# Patient Record
Sex: Female | Born: 1955
Health system: Southern US, Community
[De-identification: ages and names within clinical notes are randomized; demographics above are authoritative.]

## PROBLEM LIST (undated history)

## (undated) DIAGNOSIS — C801 Malignant (primary) neoplasm, unspecified: Secondary | ICD-10-CM

## (undated) DIAGNOSIS — K5900 Constipation, unspecified: Secondary | ICD-10-CM

## (undated) DIAGNOSIS — J3089 Other allergic rhinitis: Secondary | ICD-10-CM

## (undated) DIAGNOSIS — M1990 Unspecified osteoarthritis, unspecified site: Secondary | ICD-10-CM

## (undated) DIAGNOSIS — J45909 Unspecified asthma, uncomplicated: Secondary | ICD-10-CM

## (undated) DIAGNOSIS — I878 Other specified disorders of veins: Principal | ICD-10-CM

## (undated) DIAGNOSIS — J189 Pneumonia, unspecified organism: Secondary | ICD-10-CM

## (undated) HISTORY — DX: Unspecified asthma, uncomplicated: J45.909

## (undated) HISTORY — DX: Other specified disorders of veins: I87.8

## (undated) HISTORY — DX: Other allergic rhinitis: J30.89

## (undated) HISTORY — PX: COLONOSCOPY: SHX174

---

## 2001-02-11 ENCOUNTER — Ambulatory Visit (HOSPITAL_COMMUNITY): Admission: RE | Admit: 2001-02-11 | Discharge: 2001-02-11 | Payer: Self-pay | Admitting: Specialist

## 2001-02-11 ENCOUNTER — Encounter: Payer: Self-pay | Admitting: Specialist

## 2001-03-28 ENCOUNTER — Other Ambulatory Visit: Admission: RE | Admit: 2001-03-28 | Discharge: 2001-03-28 | Payer: Self-pay | Admitting: Obstetrics and Gynecology

## 2004-06-09 ENCOUNTER — Other Ambulatory Visit: Admission: RE | Admit: 2004-06-09 | Discharge: 2004-06-09 | Payer: Self-pay | Admitting: Dermatology

## 2004-09-27 ENCOUNTER — Ambulatory Visit (HOSPITAL_COMMUNITY): Admission: RE | Admit: 2004-09-27 | Discharge: 2004-09-27 | Payer: Self-pay | Admitting: Specialist

## 2005-01-07 ENCOUNTER — Emergency Department (HOSPITAL_COMMUNITY): Admission: EM | Admit: 2005-01-07 | Discharge: 2005-01-07 | Payer: Self-pay | Admitting: Emergency Medicine

## 2005-11-23 ENCOUNTER — Ambulatory Visit (HOSPITAL_COMMUNITY): Admission: RE | Admit: 2005-11-23 | Discharge: 2005-11-23 | Payer: Self-pay | Admitting: Family Medicine

## 2005-12-05 ENCOUNTER — Ambulatory Visit (HOSPITAL_COMMUNITY): Admission: RE | Admit: 2005-12-05 | Discharge: 2005-12-05 | Payer: Self-pay | Admitting: Family Medicine

## 2007-02-10 ENCOUNTER — Ambulatory Visit (HOSPITAL_COMMUNITY): Admission: RE | Admit: 2007-02-10 | Discharge: 2007-02-10 | Payer: Self-pay | Admitting: Specialist

## 2007-02-26 ENCOUNTER — Ambulatory Visit (HOSPITAL_COMMUNITY): Admission: RE | Admit: 2007-02-26 | Discharge: 2007-02-26 | Payer: Self-pay | Admitting: Specialist

## 2008-02-20 ENCOUNTER — Ambulatory Visit: Payer: Self-pay | Admitting: Gastroenterology

## 2008-02-20 DIAGNOSIS — M129 Arthropathy, unspecified: Secondary | ICD-10-CM | POA: Insufficient documentation

## 2008-02-20 DIAGNOSIS — K602 Anal fissure, unspecified: Secondary | ICD-10-CM | POA: Insufficient documentation

## 2008-02-20 DIAGNOSIS — R109 Unspecified abdominal pain: Secondary | ICD-10-CM | POA: Insufficient documentation

## 2008-02-20 DIAGNOSIS — I10 Essential (primary) hypertension: Secondary | ICD-10-CM | POA: Insufficient documentation

## 2008-02-20 LAB — CONVERTED CEMR LAB
ALT: 15 units/L (ref 0–35)
AST: 18 units/L (ref 0–37)
Albumin: 3.3 g/dL — ABNORMAL LOW (ref 3.5–5.2)
Alkaline Phosphatase: 49 units/L (ref 39–117)
BUN: 15 mg/dL (ref 6–23)
Basophils Absolute: 0 10*3/uL (ref 0.0–0.1)
Basophils Relative: 0 % (ref 0.0–3.0)
Bilirubin, Direct: 0.1 mg/dL (ref 0.0–0.3)
CO2: 28 meq/L (ref 19–32)
Calcium: 8.8 mg/dL (ref 8.4–10.5)
Chloride: 108 meq/L (ref 96–112)
Creatinine, Ser: 0.7 mg/dL (ref 0.4–1.2)
Eosinophils Absolute: 0.2 10*3/uL (ref 0.0–0.7)
Eosinophils Relative: 3.2 % (ref 0.0–5.0)
Ferritin: 68.2 ng/mL (ref 10.0–291.0)
Folate: 11.1 ng/mL
GFR calc Af Amer: 113 mL/min
GFR calc non Af Amer: 93 mL/min
Glucose, Bld: 89 mg/dL (ref 70–99)
HCT: 36.5 % (ref 36.0–46.0)
Hemoglobin: 12.8 g/dL (ref 12.0–15.0)
Iron: 75 ug/dL (ref 42–145)
Lymphocytes Relative: 27.2 % (ref 12.0–46.0)
MCHC: 35.1 g/dL (ref 30.0–36.0)
MCV: 96.7 fL (ref 78.0–100.0)
Monocytes Absolute: 0.7 10*3/uL (ref 0.1–1.0)
Monocytes Relative: 8.8 % (ref 3.0–12.0)
Neutro Abs: 4.7 10*3/uL (ref 1.4–7.7)
Neutrophils Relative %: 60.8 % (ref 43.0–77.0)
Platelets: 239 10*3/uL (ref 150–400)
Potassium: 4.1 meq/L (ref 3.5–5.1)
RBC: 3.78 M/uL — ABNORMAL LOW (ref 3.87–5.11)
RDW: 12.1 % (ref 11.5–14.6)
Saturation Ratios: 22.1 % (ref 20.0–50.0)
Sodium: 143 meq/L (ref 135–145)
TSH: 1.93 microintl units/mL (ref 0.35–5.50)
Total Bilirubin: 0.5 mg/dL (ref 0.3–1.2)
Total Protein: 6.3 g/dL (ref 6.0–8.3)
Transferrin: 242.9 mg/dL (ref >=212.0)
Vitamin B-12: 252 pg/mL (ref 211–911)
WBC: 7.7 10*3/uL (ref 4.5–10.5)

## 2008-03-22 ENCOUNTER — Ambulatory Visit: Payer: Self-pay | Admitting: Gastroenterology

## 2008-03-25 ENCOUNTER — Telehealth: Payer: Self-pay | Admitting: Gastroenterology

## 2009-10-11 ENCOUNTER — Ambulatory Visit (HOSPITAL_COMMUNITY): Admission: RE | Admit: 2009-10-11 | Discharge: 2009-10-11 | Payer: Self-pay | Admitting: Obstetrics and Gynecology

## 2009-10-19 ENCOUNTER — Ambulatory Visit (HOSPITAL_COMMUNITY)
Admission: RE | Admit: 2009-10-19 | Discharge: 2009-10-19 | Payer: Self-pay | Source: Home / Self Care | Admitting: Obstetrics and Gynecology

## 2010-02-09 ENCOUNTER — Emergency Department (HOSPITAL_COMMUNITY)
Admission: EM | Admit: 2010-02-09 | Discharge: 2010-02-09 | Disposition: A | Discharge status: Home / Self Care | Payer: BC Managed Care – PPO | Attending: Emergency Medicine | Admitting: Emergency Medicine

## 2010-02-09 ENCOUNTER — Emergency Department (HOSPITAL_COMMUNITY): Payer: BC Managed Care – PPO

## 2010-02-09 DIAGNOSIS — R0989 Other specified symptoms and signs involving the circulatory and respiratory systems: Secondary | ICD-10-CM | POA: Insufficient documentation

## 2010-02-09 DIAGNOSIS — R5381 Other malaise: Secondary | ICD-10-CM | POA: Insufficient documentation

## 2010-02-09 DIAGNOSIS — I10 Essential (primary) hypertension: Secondary | ICD-10-CM | POA: Insufficient documentation

## 2010-02-09 DIAGNOSIS — R0609 Other forms of dyspnea: Secondary | ICD-10-CM | POA: Insufficient documentation

## 2010-02-09 DIAGNOSIS — R002 Palpitations: Secondary | ICD-10-CM | POA: Insufficient documentation

## 2010-02-09 DIAGNOSIS — R55 Syncope and collapse: Secondary | ICD-10-CM | POA: Insufficient documentation

## 2010-02-09 DIAGNOSIS — Z79899 Other long term (current) drug therapy: Secondary | ICD-10-CM | POA: Insufficient documentation

## 2010-02-09 DIAGNOSIS — I959 Hypotension, unspecified: Secondary | ICD-10-CM | POA: Insufficient documentation

## 2010-02-09 LAB — URINALYSIS, ROUTINE W REFLEX MICROSCOPIC
Bilirubin Urine: NEGATIVE
Hgb urine dipstick: NEGATIVE
Ketones, ur: NEGATIVE mg/dL
Nitrite: NEGATIVE
Protein, ur: NEGATIVE mg/dL
Specific Gravity, Urine: 1.015 (ref 1.005–1.030)
Urine Glucose, Fasting: NEGATIVE mg/dL
Urobilinogen, UA: 0.2 mg/dL (ref 0.0–1.0)
pH: 8 (ref 5.0–8.0)

## 2010-02-09 LAB — BASIC METABOLIC PANEL
BUN: 16 mg/dL (ref 6–23)
CO2: 25 mEq/L (ref 19–32)
Calcium: 9.1 mg/dL (ref 8.4–10.5)
Chloride: 105 mEq/L (ref 96–112)
Creatinine, Ser: 0.8 mg/dL (ref 0.4–1.2)
GFR calc Af Amer: >60 mL/min (ref >60)
GFR calc non Af Amer: >60 mL/min (ref >60)
Glucose, Bld: 97 mg/dL (ref 70–99)
Potassium: 4.1 mEq/L (ref 3.5–5.1)
Sodium: 137 mEq/L (ref 135–145)

## 2010-02-09 LAB — CBC
HCT: 39.4 % (ref 36.0–46.0)
Hemoglobin: 13.7 g/dL (ref 12.0–15.0)
MCH: 31.6 pg (ref 26.0–34.0)
MCHC: 34.8 g/dL (ref 30.0–36.0)
MCV: 90.8 fL (ref 78.0–100.0)
Platelets: 210 10*3/uL (ref 150–400)
RBC: 4.34 MIL/uL (ref 3.87–5.11)
RDW: 13.2 % (ref 11.5–15.5)
WBC: 6.5 10*3/uL (ref 4.0–10.5)

## 2010-05-19 NOTE — Procedures (Signed)
Olivia Huffman, WNUK NO.:  [id number]   MEDICAL RECORD NO.:  [id number]           PATIENT TYPE:   LOCATION:                                 FACILITY:   PHYSICIAN:  Edward L. Juanetta Gosling, M.D.DATE OF BIRTH:  Jan 09, 1955   DATE OF PROCEDURE:  12/07/2005  DATE OF DISCHARGE:                            PULMONARY FUNCTION TEST   1. Spirometry does not show a definitive ventilatory defect, but does      show evidence of airflow obstruction.  2. Lung volumes are normal.  3. DLCO is normal.  4. Arterial blood gases are normal.  5. There is significant bronchodilator improvement.  6. This is consistent with asthma or other reversible airflow      obstruction.      Edward L. Juanetta Gosling, M.D.  Electronically Signed     ELH/MEDQ  D:  12/07/2005  T:  12/07/2005  Job:  [id number]

## 2010-11-16 ENCOUNTER — Other Ambulatory Visit (HOSPITAL_COMMUNITY): Payer: Self-pay | Admitting: Obstetrics and Gynecology

## 2010-11-16 DIAGNOSIS — Z139 Encounter for screening, unspecified: Secondary | ICD-10-CM

## 2010-11-27 ENCOUNTER — Ambulatory Visit (HOSPITAL_COMMUNITY)
Admission: RE | Admit: 2010-11-27 | Discharge: 2010-11-27 | Disposition: A | Discharge status: Home / Self Care | Payer: BC Managed Care – PPO | Source: Ambulatory Visit | Attending: Obstetrics and Gynecology | Admitting: Obstetrics and Gynecology

## 2010-11-27 DIAGNOSIS — Z139 Encounter for screening, unspecified: Secondary | ICD-10-CM

## 2010-11-27 DIAGNOSIS — Z1231 Encounter for screening mammogram for malignant neoplasm of breast: Secondary | ICD-10-CM | POA: Insufficient documentation

## 2010-12-04 ENCOUNTER — Other Ambulatory Visit: Payer: Self-pay | Admitting: Obstetrics and Gynecology

## 2010-12-04 DIAGNOSIS — R928 Other abnormal and inconclusive findings on diagnostic imaging of breast: Secondary | ICD-10-CM

## 2011-01-10 ENCOUNTER — Other Ambulatory Visit (HOSPITAL_COMMUNITY): Payer: Self-pay | Admitting: Obstetrics and Gynecology

## 2011-01-10 ENCOUNTER — Ambulatory Visit (HOSPITAL_COMMUNITY)
Admission: RE | Admit: 2011-01-10 | Discharge: 2011-01-10 | Disposition: A | Discharge status: Home / Self Care | Payer: BC Managed Care – PPO | Source: Ambulatory Visit | Attending: Obstetrics and Gynecology | Admitting: Obstetrics and Gynecology

## 2011-01-10 DIAGNOSIS — R928 Other abnormal and inconclusive findings on diagnostic imaging of breast: Secondary | ICD-10-CM

## 2011-04-27 ENCOUNTER — Other Ambulatory Visit: Payer: Self-pay | Admitting: Obstetrics and Gynecology

## 2011-04-27 DIAGNOSIS — Z780 Asymptomatic menopausal state: Secondary | ICD-10-CM

## 2011-06-05 ENCOUNTER — Ambulatory Visit
Admission: RE | Admit: 2011-06-05 | Discharge: 2011-06-05 | Disposition: A | Discharge status: Home / Self Care | Payer: PRIVATE HEALTH INSURANCE | Source: Ambulatory Visit | Attending: Obstetrics and Gynecology | Admitting: Obstetrics and Gynecology

## 2011-06-05 DIAGNOSIS — Z780 Asymptomatic menopausal state: Secondary | ICD-10-CM

## 2011-08-09 ENCOUNTER — Other Ambulatory Visit: Payer: Self-pay | Admitting: Obstetrics and Gynecology

## 2011-08-09 DIAGNOSIS — N63 Unspecified lump in unspecified breast: Secondary | ICD-10-CM

## 2011-08-17 ENCOUNTER — Ambulatory Visit (HOSPITAL_COMMUNITY)
Admission: RE | Admit: 2011-08-17 | Discharge: 2011-08-17 | Disposition: A | Discharge status: Home / Self Care | Payer: PRIVATE HEALTH INSURANCE | Source: Ambulatory Visit | Attending: Family Medicine | Admitting: Family Medicine

## 2011-08-17 ENCOUNTER — Other Ambulatory Visit: Payer: Self-pay | Admitting: Family Medicine

## 2011-08-17 DIAGNOSIS — M5030 Other cervical disc degeneration, unspecified cervical region: Secondary | ICD-10-CM | POA: Insufficient documentation

## 2011-08-17 DIAGNOSIS — M542 Cervicalgia: Secondary | ICD-10-CM

## 2011-08-17 DIAGNOSIS — M47812 Spondylosis without myelopathy or radiculopathy, cervical region: Secondary | ICD-10-CM | POA: Insufficient documentation

## 2011-08-24 ENCOUNTER — Ambulatory Visit
Admission: RE | Admit: 2011-08-24 | Discharge: 2011-08-24 | Disposition: A | Discharge status: Home / Self Care | Payer: PRIVATE HEALTH INSURANCE | Source: Ambulatory Visit | Attending: Obstetrics and Gynecology | Admitting: Obstetrics and Gynecology

## 2011-08-24 DIAGNOSIS — N63 Unspecified lump in unspecified breast: Secondary | ICD-10-CM

## 2012-05-05 ENCOUNTER — Other Ambulatory Visit: Payer: Self-pay | Admitting: Obstetrics and Gynecology

## 2012-05-05 DIAGNOSIS — N63 Unspecified lump in unspecified breast: Secondary | ICD-10-CM

## 2012-05-29 ENCOUNTER — Other Ambulatory Visit: Payer: Self-pay | Admitting: Obstetrics and Gynecology

## 2012-05-29 ENCOUNTER — Ambulatory Visit
Admission: RE | Admit: 2012-05-29 | Discharge: 2012-05-29 | Disposition: A | Discharge status: Home / Self Care | Payer: PRIVATE HEALTH INSURANCE | Source: Ambulatory Visit | Attending: Obstetrics and Gynecology | Admitting: Obstetrics and Gynecology

## 2012-05-29 DIAGNOSIS — N63 Unspecified lump in unspecified breast: Secondary | ICD-10-CM

## 2012-07-07 ENCOUNTER — Other Ambulatory Visit: Payer: Self-pay | Admitting: Family Medicine

## 2012-07-21 ENCOUNTER — Telehealth: Payer: Self-pay | Admitting: Family Medicine

## 2012-07-21 NOTE — Telephone Encounter (Signed)
Patient advised that Rx was sent to [facility] according to system with refills. Patient stated she will call pharmacy back and check on it.

## 2012-07-21 NOTE — Telephone Encounter (Signed)
Left message on voicemail to return call.

## 2012-07-21 NOTE — Telephone Encounter (Signed)
Patient has an appointment on Friday July 25, 2012 @ 8:10am  Patient states her Singular refill was denied however she wants to know if she can have at least 5 days worth of medication phoned into [facility] in [place].  States she is having trouble breathing.  Please call patient at work  Thanks

## 2012-07-25 ENCOUNTER — Encounter: Payer: Self-pay | Admitting: Family Medicine

## 2012-07-25 ENCOUNTER — Ambulatory Visit (INDEPENDENT_AMBULATORY_CARE_PROVIDER_SITE_OTHER): Payer: No Typology Code available for payment source | Admitting: Family Medicine

## 2012-07-25 VITALS — BP 110/72 | HR 60 | Wt 175.0 lb

## 2012-07-25 DIAGNOSIS — J45909 Unspecified asthma, uncomplicated: Secondary | ICD-10-CM

## 2012-07-25 DIAGNOSIS — J309 Allergic rhinitis, unspecified: Secondary | ICD-10-CM

## 2012-07-25 DIAGNOSIS — J4520 Mild intermittent asthma, uncomplicated: Secondary | ICD-10-CM

## 2012-07-25 DIAGNOSIS — I10 Essential (primary) hypertension: Secondary | ICD-10-CM

## 2012-07-25 MED ORDER — MONTELUKAST SODIUM 10 MG PO TABS: 10.0000 mg | ORAL_TABLET | Freq: Every day | ORAL | Status: DC

## 2012-07-25 NOTE — Progress Notes (Signed)
  Subjective:    Patient ID: Olivia Huffman, female    DOB: 03-30-1955, 57 y.o.   MRN: [id number]  HPI  Sig allergies--out of singulair.a llergies kicked in.  Some difficulty with wheezing.  Minimal need for inhaler when taking faithfully.  Allergies are now pretty much year round.  History of high blood pressure. Patient watching salt intake. Exercising regularly. Currently on no medications for this.  Review of Systems No chest pain no abdominal pain no chronic cough ROS otherwise negative    Objective:   Physical Exam  Alert mild nasal congestion. Vital stable. HEENT normal. Lungs clear. Heart regular rate and rhythm.      Assessment & Plan:  Impression 1 allergic rhinitis good control as long as she stays on Singulair. #2 asthma clinically stable. #3 hypertension controlled by diet and exercise alone. Plan maintain same meds. Diet exercise discussed. Check yearly. WSL

## 2012-07-27 DIAGNOSIS — J45909 Unspecified asthma, uncomplicated: Secondary | ICD-10-CM | POA: Insufficient documentation

## 2012-07-27 DIAGNOSIS — J309 Allergic rhinitis, unspecified: Secondary | ICD-10-CM | POA: Insufficient documentation

## 2012-09-11 ENCOUNTER — Encounter: Payer: Self-pay | Admitting: Family Medicine

## 2012-09-11 ENCOUNTER — Ambulatory Visit (INDEPENDENT_AMBULATORY_CARE_PROVIDER_SITE_OTHER): Payer: No Typology Code available for payment source | Admitting: Family Medicine

## 2012-09-11 VITALS — BP 132/92 | Temp 98.4°F | Ht 66.0 in | Wt 175.2 lb

## 2012-09-11 DIAGNOSIS — J310 Chronic rhinitis: Secondary | ICD-10-CM

## 2012-09-11 DIAGNOSIS — J329 Chronic sinusitis, unspecified: Secondary | ICD-10-CM

## 2012-09-11 MED ORDER — AMOXICILLIN-POT CLAVULANATE 875-125 MG PO TABS: 1.0000 | ORAL_TABLET | Freq: Two times a day (BID) | ORAL | Status: AC

## 2012-09-11 NOTE — Progress Notes (Signed)
  Subjective:    Patient ID: Olivia Huffman, female    DOB: 09-Mar-1955, 57 y.o.   MRN: [id number]  Cough This is a new problem. The current episode started in the past 7 days. The problem has been gradually worsening. The problem occurs every few hours. The cough is non-productive. Associated symptoms include headaches, nasal congestion and postnasal drip. She has tried OTC cough suppressant for the symptoms. The treatment provided mild relief.   Started over the weekend, getting worse, head aching, No actual fever.  Energy virtually zero.  Diminished energy Took comtrex, then tried [employer],  Review of Systems  HENT: Positive for postnasal drip.   Respiratory: Positive for cough.   Neurological: Positive for headaches.       Objective:   Physical Exam Alert no acute distress. Lungs clear. Heart regular rate and rhythm. Frontal maxillary tenderness. Neck supple. Lungs clear. Heart regular rate and rhythm.       Assessment & Plan:  Impression #1 rhinosinusitis. Discussed. Plan Augmentin twice a day 10 days. Maintain Xopenex when necessary for wheezes. Symptomatic care discussed. Warning signs discussed. WSL

## 2013-07-07 ENCOUNTER — Encounter (HOSPITAL_COMMUNITY): Payer: Self-pay | Admitting: Pharmacy Technician

## 2013-07-08 ENCOUNTER — Encounter (HOSPITAL_COMMUNITY): Payer: Self-pay

## 2013-07-08 ENCOUNTER — Encounter (HOSPITAL_COMMUNITY)
Admission: RE | Admit: 2013-07-08 | Discharge: 2013-07-08 | Disposition: A | Discharge status: Home / Self Care | Payer: PRIVATE HEALTH INSURANCE | Source: Ambulatory Visit | Attending: Plastic Surgery | Admitting: Plastic Surgery

## 2013-07-08 DIAGNOSIS — Z01812 Encounter for preprocedural laboratory examination: Secondary | ICD-10-CM | POA: Insufficient documentation

## 2013-07-08 DIAGNOSIS — Z01810 Encounter for preprocedural cardiovascular examination: Secondary | ICD-10-CM | POA: Insufficient documentation

## 2013-07-08 HISTORY — DX: Pneumonia, unspecified organism: J18.9

## 2013-07-08 HISTORY — DX: Unspecified osteoarthritis, unspecified site: M19.90

## 2013-07-08 HISTORY — DX: Constipation, unspecified: K59.00

## 2013-07-08 LAB — BASIC METABOLIC PANEL
Anion gap: 12 (ref 5–15)
BUN: 13 mg/dL (ref 6–23)
CO2: 25 mEq/L (ref 19–32)
Calcium: 9.4 mg/dL (ref 8.4–10.5)
Chloride: 102 mEq/L (ref 96–112)
Creatinine, Ser: 0.71 mg/dL (ref 0.50–1.10)
GFR calc Af Amer: >90 mL/min (ref >90)
GFR calc non Af Amer: >90 mL/min (ref >90)
Glucose, Bld: 95 mg/dL (ref 70–99)
Potassium: 4.3 mEq/L (ref 3.7–5.3)
Sodium: 139 mEq/L (ref 137–147)

## 2013-07-08 LAB — CBC
HCT: 41 % (ref 36.0–46.0)
Hemoglobin: 13.6 g/dL (ref 12.0–15.0)
MCH: 31.1 pg (ref 26.0–34.0)
MCHC: 33.2 g/dL (ref 30.0–36.0)
MCV: 93.8 fL (ref 78.0–100.0)
Platelets: 244 10*3/uL (ref 150–400)
RBC: 4.37 MIL/uL (ref 3.87–5.11)
RDW: 13.5 % (ref 11.5–15.5)
WBC: 7 10*3/uL (ref 4.0–10.5)

## 2013-07-08 NOTE — Pre-Procedure Instructions (Signed)
Olivia Huffman  07/08/2013   Your procedure is scheduled on:  Thursday July 16, 2013 at 2:39 PM.  Report to [facility] Admitting at 12:40 PM.  Call this number if you have problems the morning of surgery: [phone]   Remember:   Do not eat food or drink liquids after midnight.   Take these medicines the morning of surgery with A SIP OF WATER: Acetaminophen (Tylenol) if needed, Systane eye drops   Do not wear jewelry, make-up or nail polish.  Do not wear lotions, powders, or perfumes. .  Do not shave 48 hours prior to surgery.   Do not bring valuables to the hospital.  [facility] is not responsible for any belongings or valuables.               Contacts, dentures or bridgework may not be worn into surgery.  Leave suitcase in the car. After surgery it may be brought to your room.  For patients admitted to the hospital, discharge time is determined by your treatment team.               Patients discharged the day of surgery will not be allowed to drive home.  Name and phone number of your driver: Family/Friend  Special Instructions: Shower using CHG soap the night before and the morning of your surgery   Please read over the following fact sheets that you were given: Pain Booklet, Coughing and Deep Breathing and Surgical Site Infection Prevention

## 2013-07-08 NOTE — Progress Notes (Signed)
Patient denied having a stress test, cardiac cath, or sleep study. Patient denied having any shortness of breath and informed Nurse that she has inhalers at home but has not had to use them. Nurse questioned patient about having hypertension and patient stated "I used to have high blood pressure but they took me off of it about 3 years ago. I cut back on the salt so...." Nurse called Ebony Hail, PA to inquire if a chest xray and EKG would be needed. Ebony Hail said that a EKG should be obtained, but since patient has not had any pulmonary issues then a chest xray would not be needed at this time. However DOS if assigned Anesthesiologist decides that patient needs to have a chest xray then it can be obtained then.

## 2013-07-09 NOTE — Progress Notes (Signed)
Anesthesia Chart Review:  Patient is a 58 year old female scheduled for excision of LLE melanoma, skin graft full thickness LLEL on 07/16/13 by Dr. Harlow Mares.  History includes non-smoker, asthma/reactive airways disease (without recent issues or need for inhalers), allergic rhinitis, arthritis. She reported previous treatment for HTN, but was taken off of medication three years ago. BP was 130/88 at PAT. PCP is listed as Dr. Grace Bushy. [place].   EKG on 07/08/13 showed NSR. Preoperative labs noted. Since she reported that no treatment for HTN was needed in the past three years, she was not a smoker, and that she has had no recent issues with her asthma, then I did not feel she would need a preoperative CXR.  If her assigned anesthesiologist disagrees then one can be done on the day of surgery.  George Hugh [facility]/Anesthesiology Phone [phone] 07/09/2013 10:39 AM

## 2013-07-13 ENCOUNTER — Other Ambulatory Visit: Payer: Self-pay | Admitting: Plastic Surgery

## 2013-07-15 MED ORDER — CEFAZOLIN SODIUM-DEXTROSE 2-3 GM-% IV SOLR
2.0000 g | INTRAVENOUS | Status: AC
  Administered 2013-07-16: 2 g via INTRAVENOUS
  Filled 2013-07-15: qty 50

## 2013-07-15 MED ORDER — CHLORHEXIDINE GLUCONATE 4 % EX LIQD
1.0000 "application " | Freq: Once | CUTANEOUS | Status: DC
  Filled 2013-07-15: qty 15

## 2013-07-15 MED ORDER — HEPARIN SODIUM (PORCINE) 5000 UNIT/ML IJ SOLN
5000.0000 [IU] | Freq: Once | INTRAMUSCULAR | Status: AC
  Administered 2013-07-16: 5000 [IU] via SUBCUTANEOUS
  Filled 2013-07-15: qty 1

## 2013-07-16 ENCOUNTER — Encounter (HOSPITAL_COMMUNITY)
Admission: RE | Disposition: A | Discharge status: Home / Self Care | Payer: Self-pay | Source: Ambulatory Visit | Attending: Plastic Surgery

## 2013-07-16 ENCOUNTER — Encounter (HOSPITAL_COMMUNITY): Payer: Self-pay | Admitting: *Deleted

## 2013-07-16 ENCOUNTER — Ambulatory Visit (HOSPITAL_COMMUNITY)
Admission: RE | Admit: 2013-07-16 | Discharge: 2013-07-16 | Disposition: A | Discharge status: Home / Self Care | Payer: No Typology Code available for payment source | Source: Ambulatory Visit | Attending: Plastic Surgery | Admitting: Plastic Surgery

## 2013-07-16 ENCOUNTER — Ambulatory Visit (HOSPITAL_COMMUNITY): Payer: No Typology Code available for payment source | Admitting: Anesthesiology

## 2013-07-16 ENCOUNTER — Encounter (HOSPITAL_COMMUNITY): Payer: No Typology Code available for payment source | Admitting: Vascular Surgery

## 2013-07-16 DIAGNOSIS — J45909 Unspecified asthma, uncomplicated: Secondary | ICD-10-CM | POA: Insufficient documentation

## 2013-07-16 DIAGNOSIS — I10 Essential (primary) hypertension: Secondary | ICD-10-CM | POA: Insufficient documentation

## 2013-07-16 DIAGNOSIS — C4370 Malignant melanoma of unspecified lower limb, including hip: Secondary | ICD-10-CM | POA: Insufficient documentation

## 2013-07-16 HISTORY — PX: LESION REMOVAL: SHX5196

## 2013-07-16 SURGERY — WIDE EXCISION, LESION, UPPER EXTREMITY
Anesthesia: General | Site: Leg Lower | Laterality: Left

## 2013-07-16 MED ORDER — OXYCODONE HCL 5 MG PO TABS
5.0000 mg | ORAL_TABLET | Freq: Once | ORAL | Status: DC | PRN
Start: 2013-07-16 — End: 2013-07-16

## 2013-07-16 MED ORDER — OXYCODONE HCL 5 MG/5ML PO SOLN: 5.0000 mg | Freq: Once | ORAL | Status: DC | PRN

## 2013-07-16 MED ORDER — SODIUM CHLORIDE 0.9 % IR SOLN
Status: DC | PRN
  Administered 2013-07-16: 1000 mL

## 2013-07-16 MED ORDER — ROCURONIUM BROMIDE 50 MG/5ML IV SOLN
INTRAVENOUS | Status: AC
  Filled 2013-07-16: qty 1

## 2013-07-16 MED ORDER — MIDAZOLAM HCL 5 MG/5ML IJ SOLN
INTRAMUSCULAR | Status: DC | PRN
  Administered 2013-07-16: 2 mg via INTRAVENOUS

## 2013-07-16 MED ORDER — ONDANSETRON HCL 4 MG/2ML IJ SOLN
INTRAMUSCULAR | Status: AC
  Filled 2013-07-16: qty 2

## 2013-07-16 MED ORDER — LACTATED RINGERS IV SOLN
INTRAVENOUS | Status: DC
  Administered 2013-07-16: 11:00:00 via INTRAVENOUS

## 2013-07-16 MED ORDER — BACITRACIN ZINC 500 UNIT/GM EX OINT
TOPICAL_OINTMENT | CUTANEOUS | Status: AC
  Filled 2013-07-16: qty 15

## 2013-07-16 MED ORDER — PROPOFOL 10 MG/ML IV BOLUS
INTRAVENOUS | Status: AC
  Filled 2013-07-16: qty 20

## 2013-07-16 MED ORDER — METHOCARBAMOL 500 MG PO TABS: 500.0000 mg | ORAL_TABLET | Freq: Four times a day (QID) | ORAL | Status: DC

## 2013-07-16 MED ORDER — HYDROMORPHONE HCL 2 MG PO TABS
2.0000 mg | ORAL_TABLET | ORAL | Status: DC | PRN
Start: 2013-07-16 — End: 2013-07-16

## 2013-07-16 MED ORDER — BUPIVACAINE-EPINEPHRINE (PF) 0.25% -1:200000 IJ SOLN
INTRAMUSCULAR | Status: AC
  Filled 2013-07-16: qty 30

## 2013-07-16 MED ORDER — ONDANSETRON HCL 4 MG/2ML IJ SOLN: 4.0000 mg | Freq: Once | INTRAMUSCULAR | Status: DC | PRN

## 2013-07-16 MED ORDER — MIDAZOLAM HCL 2 MG/2ML IJ SOLN
INTRAMUSCULAR | Status: AC
  Filled 2013-07-16: qty 2

## 2013-07-16 MED ORDER — PROPOFOL 10 MG/ML IV BOLUS
INTRAVENOUS | Status: DC | PRN
  Administered 2013-07-16: 200 mg via INTRAVENOUS

## 2013-07-16 MED ORDER — HYDROMORPHONE HCL 2 MG PO TABS: 2.0000 mg | ORAL_TABLET | ORAL | Status: DC | PRN

## 2013-07-16 MED ORDER — LIDOCAINE HCL (CARDIAC) 20 MG/ML IV SOLN
INTRAVENOUS | Status: DC | PRN
  Administered 2013-07-16: 100 mg via INTRAVENOUS

## 2013-07-16 MED ORDER — FENTANYL CITRATE 0.05 MG/ML IJ SOLN
INTRAMUSCULAR | Status: DC | PRN
  Administered 2013-07-16: 100 ug via INTRAVENOUS

## 2013-07-16 MED ORDER — ONDANSETRON HCL 4 MG/2ML IJ SOLN
INTRAMUSCULAR | Status: DC | PRN
  Administered 2013-07-16: 4 mg via INTRAVENOUS

## 2013-07-16 MED ORDER — FENTANYL CITRATE 0.05 MG/ML IJ SOLN
INTRAMUSCULAR | Status: AC
  Filled 2013-07-16: qty 5

## 2013-07-16 MED ORDER — HYDROMORPHONE HCL PF 1 MG/ML IJ SOLN: 0.2500 mg | INTRAMUSCULAR | Status: DC | PRN

## 2013-07-16 MED ORDER — BUPIVACAINE-EPINEPHRINE 0.5% -1:200000 IJ SOLN
INTRAMUSCULAR | Status: DC | PRN
  Administered 2013-07-16: 4 mL

## 2013-07-16 MED ORDER — MEPERIDINE HCL 25 MG/ML IJ SOLN: 6.2500 mg | INTRAMUSCULAR | Status: DC | PRN

## 2013-07-16 MED ORDER — MINERAL OIL LIGHT 100 % EX OIL
TOPICAL_OIL | CUTANEOUS | Status: AC
  Filled 2013-07-16: qty 25

## 2013-07-16 SURGICAL SUPPLY — 66 items
BANDAGE ELASTIC 6 VELCRO ST LF (GAUZE/BANDAGES/DRESSINGS) ×1 IMPLANT
BANDAGE GAUZE ELAST BULKY 4 IN (GAUZE/BANDAGES/DRESSINGS) ×1 IMPLANT
BLADE 10 SAFETY STRL DISP (BLADE) ×2 IMPLANT
BLADE SURG ROTATE 9660 (MISCELLANEOUS) IMPLANT
BNDG COHESIVE 4X5 TAN STRL (GAUZE/BANDAGES/DRESSINGS) ×1 IMPLANT
CANISTER SUCTION 2500CC (MISCELLANEOUS) ×2 IMPLANT
CANISTER WOUND CARE 500ML ATS (WOUND CARE) IMPLANT
CHLORAPREP W/TINT 26ML (MISCELLANEOUS) ×1 IMPLANT
COVER SURGICAL LIGHT HANDLE (MISCELLANEOUS) ×2 IMPLANT
DERMACARRIERS GRAFT 1 TO 1.5 (DISPOSABLE)
DRAPE LAPAROSCOPIC ABDOMINAL (DRAPES) IMPLANT
DRAPE ORTHO SPLIT 77X108 STRL (DRAPES)
DRAPE SURG ORHT 6 SPLT 77X108 (DRAPES) IMPLANT
DRSG ADAPTIC 3X8 NADH LF (GAUZE/BANDAGES/DRESSINGS) IMPLANT
DRSG MEPITEL 4X7.2 (GAUZE/BANDAGES/DRESSINGS) IMPLANT
DRSG PAD ABDOMINAL 8X10 ST (GAUZE/BANDAGES/DRESSINGS) IMPLANT
DRSG TELFA 3X8 NADH (GAUZE/BANDAGES/DRESSINGS) ×2 IMPLANT
DRSG VAC ATS MED SENSATRAC (GAUZE/BANDAGES/DRESSINGS) IMPLANT
DRSG VAC ATS SM SENSATRAC (GAUZE/BANDAGES/DRESSINGS) IMPLANT
DURAPREP 26ML APPLICATOR (WOUND CARE) IMPLANT
ELECT CAUTERY BLADE 6.4 (BLADE) ×2 IMPLANT
ELECT NDL TIP 2.8 STRL (NEEDLE) ×1 IMPLANT
ELECT NEEDLE TIP 2.8 STRL (NEEDLE) ×2 IMPLANT
ELECT REM PT RETURN 9FT ADLT (ELECTROSURGICAL) ×2
ELECTRODE REM PT RTRN 9FT ADLT (ELECTROSURGICAL) ×1 IMPLANT
GLOVE BIO SURGEON STRL SZ7.5 (GLOVE) ×2 IMPLANT
GLOVE BIOGEL PI IND STRL 7.0 (GLOVE) IMPLANT
GLOVE BIOGEL PI IND STRL 7.5 (GLOVE) IMPLANT
GLOVE BIOGEL PI IND STRL 8 (GLOVE) ×1 IMPLANT
GLOVE BIOGEL PI INDICATOR 7.0 (GLOVE) ×1
GLOVE BIOGEL PI INDICATOR 7.5 (GLOVE) ×1
GLOVE BIOGEL PI INDICATOR 8 (GLOVE) ×1
GLOVE SKINSENSE NS SZ7.0 (GLOVE) ×1
GLOVE SKINSENSE STRL SZ7.0 (GLOVE) IMPLANT
GOWN STRL REUS W/ TWL LRG LVL3 (GOWN DISPOSABLE) ×1 IMPLANT
GOWN STRL REUS W/ TWL XL LVL3 (GOWN DISPOSABLE) ×1 IMPLANT
GOWN STRL REUS W/TWL LRG LVL3 (GOWN DISPOSABLE) ×2
GOWN STRL REUS W/TWL XL LVL3 (GOWN DISPOSABLE) ×2
GRAFT DERMACARRIERS 1 TO 1.5 (DISPOSABLE) ×1 IMPLANT
HANDPIECE INTERPULSE COAX TIP (DISPOSABLE)
KIT BASIN OR (CUSTOM PROCEDURE TRAY) ×2 IMPLANT
KIT ROOM TURNOVER OR (KITS) ×2 IMPLANT
NDL HYPO 25GX1X1/2 BEV (NEEDLE) ×1 IMPLANT
NEEDLE HYPO 25GX1X1/2 BEV (NEEDLE) ×2 IMPLANT
NS IRRIG 1000ML POUR BTL (IV SOLUTION) ×2 IMPLANT
PACK GENERAL/GYN (CUSTOM PROCEDURE TRAY) ×2 IMPLANT
PAD ABD 8X10 STRL (GAUZE/BANDAGES/DRESSINGS) ×2 IMPLANT
PAD ARMBOARD 7.5X6 YLW CONV (MISCELLANEOUS) ×4 IMPLANT
PAD DRESSING TELFA 3X8 NADH (GAUZE/BANDAGES/DRESSINGS) IMPLANT
SET HNDPC FAN SPRY TIP SCT (DISPOSABLE) IMPLANT
SPONGE GAUZE 4X4 12PLY (GAUZE/BANDAGES/DRESSINGS) ×2 IMPLANT
SPONGE GAUZE 4X4 12PLY STER LF (GAUZE/BANDAGES/DRESSINGS) IMPLANT
STAPLER VISISTAT 35W (STAPLE) ×2 IMPLANT
STOCKINETTE IMPERVIOUS 9X36 MD (GAUZE/BANDAGES/DRESSINGS) ×1 IMPLANT
SUT MNCRL AB 3-0 PS2 18 (SUTURE) IMPLANT
SUT MNCRL AB 4-0 PS2 18 (SUTURE) IMPLANT
SUT MON AB 2-0 CT1 36 (SUTURE) ×1 IMPLANT
SUT PROLENE 1 CT (SUTURE) IMPLANT
SUT PROLENE 2 0 CT2 30 (SUTURE) ×1 IMPLANT
SUT PROLENE 4 0 PS 2 18 (SUTURE) ×1 IMPLANT
SUT SILK 3 0 SH 30 (SUTURE) ×1 IMPLANT
SUT SILK 3 0 SH CR/8 (SUTURE) ×1 IMPLANT
SUT VIC AB 3-0 SH 18 (SUTURE) IMPLANT
TOWEL OR 17X24 6PK STRL BLUE (TOWEL DISPOSABLE) ×2 IMPLANT
TOWEL OR 17X26 10 PK STRL BLUE (TOWEL DISPOSABLE) ×2 IMPLANT
WATER STERILE IRR 1000ML POUR (IV SOLUTION) IMPLANT

## 2013-07-16 NOTE — H&P (Signed)
I have re-examined and re-evaluated the patient and there are no changes. See H&P in office notes in paper chart.  Planned procedure: Wide excision of melanoma left leg and full-thickness skin graft.

## 2013-07-16 NOTE — Anesthesia Preprocedure Evaluation (Signed)
Anesthesia Evaluation  Patient identified by MRN, date of birth, ID band Patient awake    Reviewed: Allergy & Precautions, H&P , NPO status , Patient's Chart, lab work & pertinent test results  Airway Mallampati: I TM Distance: >3 FB Neck ROM: Full    Dental   Pulmonary asthma ,          Cardiovascular hypertension, Pt. on medications     Neuro/Psych    GI/Hepatic   Endo/Other    Renal/GU      Musculoskeletal   Abdominal   Peds  Hematology   Anesthesia Other Findings   Reproductive/Obstetrics                           Anesthesia Physical Anesthesia Plan  ASA: II  Anesthesia Plan: General   Post-op Pain Management:    Induction: Intravenous  Airway Management Planned: LMA  Additional Equipment:   Intra-op Plan:   Post-operative Plan: Extubation in OR  Informed Consent: I have reviewed the patients History and Physical, chart, labs and discussed the procedure including the risks, benefits and alternatives for the proposed anesthesia with the patient or authorized representative who has indicated his/her understanding and acceptance.     Plan Discussed with: CRNA and Surgeon  Anesthesia Plan Comments:         Anesthesia Quick Evaluation

## 2013-07-16 NOTE — Brief Op Note (Signed)
07/16/2013  3:18 PM  PATIENT:  Olivia Huffman  58 y.o. female  PRE-OPERATIVE DIAGNOSIS:  MELANOMA ON LOWER LEFT LEG  POST-OPERATIVE DIAGNOSIS:  melanoma left lower leg  PROCEDURE:  Procedure(s): EXCISE MELANOMA OF LOWER LEFT LEG (Left)  SURGEON:  Surgeon(s) and Role:    * Crissie Reese, MD - Primary  PHYSICIAN ASSISTANT:   ASSISTANTS: Judyann Munson, RNFA   ANESTHESIA:   general  EBL:  Total I/O In: 800 [I.V.:800] Out: -   BLOOD ADMINISTERED:none  DRAINS: none   LOCAL MEDICATIONS USED:  MARCAINE     SPECIMEN:  Source of Specimen:  left lower leg  DISPOSITION OF SPECIMEN:  PATHOLOGY  COUNTS:  YES  TOURNIQUET:   Total Tourniquet Time Documented: Thigh (Left) - 5 minutes Total: Thigh (Left) - 5 minutes   DICTATION: .Other Dictation: Dictation Number [id number]  PLAN OF CARE: Discharge to home after PACU  PATIENT DISPOSITION:  PACU - hemodynamically stable.   Delay start of Pharmacological VTE agent (>24hrs) due to surgical blood loss or risk of bleeding: not applicable

## 2013-07-16 NOTE — Discharge Instructions (Addendum)
No lifting for 6 weeks No vigorous activity for 6 weeks (including outdoor walks) No driving for 3 weeks OK to walk up stairs slowly Stay propped up Use incentive spirometer at home every hour while awake No shower yet Keep leg elevated Take an over-the-counter stool softener (such as Colace) while on pain medication See Dr. Harlow Mares in office next week  For questions call [phone] or [phone]

## 2013-07-16 NOTE — Transfer of Care (Signed)
Immediate Anesthesia Transfer of Care Note  Patient: Olivia Huffman  Procedure(s) Performed: Procedure(s): EXCISE MELANOMA OF LOWER LEFT LEG (Left)  Patient Location: PACU  Anesthesia Type:General  Level of Consciousness: awake, alert  and oriented  Airway & Oxygen Therapy: Patient Spontanous Breathing and Patient connected to nasal cannula oxygen  Post-op Assessment: Report given to PACU RN and Post -op Vital signs reviewed and stable  Post vital signs: Reviewed and stable  Complications: No apparent anesthesia complications

## 2013-07-16 NOTE — Anesthesia Procedure Notes (Signed)
Procedure Name: LMA Insertion Date/Time: 07/16/2013 1:56 PM Performed by: Rush Farmer E Pre-anesthesia Checklist: Patient identified, Emergency Drugs available, Suction available, Patient being monitored and Timeout performed Patient Re-evaluated:Patient Re-evaluated prior to inductionOxygen Delivery Method: Circle system utilized Preoxygenation: Pre-oxygenation with 100% oxygen Intubation Type: IV induction LMA: LMA inserted LMA Size: 4.0 Number of attempts: 1 Placement Confirmation: positive ETCO2 and breath sounds checked- equal and bilateral Tube secured with: Tape Dental Injury: Teeth and Oropharynx as per pre-operative assessment  Comments: LMA inserted by Manuela Schwartz, CRNA.

## 2013-07-17 ENCOUNTER — Encounter (HOSPITAL_COMMUNITY): Payer: Self-pay | Admitting: Plastic Surgery

## 2013-07-17 NOTE — Anesthesia Postprocedure Evaluation (Signed)
Anesthesia Post Note  Patient: Olivia Huffman  Procedure(s) Performed: Procedure(s) (LRB): EXCISE MELANOMA OF LOWER LEFT LEG (Left)  Anesthesia type: general  Patient location: PACU  Post pain: Pain level controlled  Post assessment: Patient's Cardiovascular Status Stable  Last Vitals:  Filed Vitals:   07/16/13 1645  BP: 137/74  Pulse: 57  Temp: 36.7 C  Resp:     Post vital signs: Reviewed and stable  Level of consciousness: sedated  Complications: No apparent anesthesia complications

## 2013-07-17 NOTE — Op Note (Signed)
NAMEMARIANNY, Olivia Huffman NO.:  [id number]  MEDICAL RECORD NO.:  [id number]  LOCATION:  [facility]                         FACILITY:  [facility]  PHYSICIAN:  Crissie Reese, M.D.     DATE OF BIRTH:  03-20-1955  DATE OF PROCEDURE:  07/16/2013 DATE OF DISCHARGE:  07/16/2013                              OPERATIVE REPORT   PREOPERATIVE DIAGNOSIS:  Melanoma, left lower leg.  POSTOPERATIVE DIAGNOSIS:  Melanoma, left lower leg.  PROCEDURE: 1. Excision of the melanoma left lower leg, greater than 3 cm. 2. Complex wound closure of left lower leg, 7 cm with a distinct     procedure.  SURGEON:  Crissie Reese, M.D.  ANESTHESIA:  General.  CLINICAL NOTE:  A  58 year old woman was seen by our dermatologist and had a lesion of her left lower leg.  A biopsy of this was performed and revealed a thin melanoma 0.51 mm absolute tumor thickness.  There was no associated adenopathy on clinical exam.  She was referred for a wide excision of this.  The anesthesia risks were discussed with her.  The plan was an excision and probable full-thickness skin graft and all this was discussed with her in detail and the risks including but not limited to, bleeding, infection, healing problems, scarring, loss of sensation, fluid accumulations, anesthesia complications, loss of the skin graft, and contour deformities as well as possibility of further surgery, and overall disappointment.  She understood all this and wished to proceed. We also discussed possibility of DVT and PE.  DESCRIPTION OF PROCEDURE:  The patient was taken to the operating room and placed supine.  After successful induction of general anesthesia, she was prepped with ChloraPrep and after awaiting full 3 minutes for drying, she was draped with sterile drapes.  Sterile tourniquet was used.  The leg was elevated and the tourniquet was inflated to 220 mmHg. Total tourniquet time was 4 minutes.  The markings were placed around the biopsy  site which was over 1 cm in diameter, and 1 cm margins around this were then marked.  Total resection was greater than 3 cm in diameter.  The excision was performed down to the underlying fascia and moist saline lap applied and the tourniquet was lowered.  After steady pressure for 1 minute, the lap was removed and meticulous hemostasis was achieved using electrocautery.  The closure was then begun at the periphery of the wound just to see how much that could be closed primarily, 2-0 Monocryl inverted interrupted deep dermal sutures were used and the wound was closed without undue tension.  At the corners, dog-ears were excised and closures with 2-0 Monocryl interrupted inverted deep dermal sutures, and then for the final layer 2-0 Prolene simple interrupted sutures as well as some 4-0 Prolene simple interrupted sutures.  The orientation of the final incision was in oblique direction.  Bacitracin antibiotic ointment, Telfa, dry sterile dressing including Kerlix, ABDs, and a 6-inch facial wrap were applied, and the Ace wrap was wrapped from the foot up to the knee.  She was then transferred to the recovery room stable having tolerated the procedure well.     Crissie Reese, M.D.  DB/MEDQ  D:  07/16/2013  T:  07/17/2013  Job:  [id number]

## 2013-08-04 ENCOUNTER — Other Ambulatory Visit: Payer: Self-pay | Admitting: Family Medicine

## 2013-08-13 ENCOUNTER — Other Ambulatory Visit: Payer: Self-pay

## 2013-08-13 DIAGNOSIS — Z1231 Encounter for screening mammogram for malignant neoplasm of breast: Secondary | ICD-10-CM

## 2013-09-09 ENCOUNTER — Ambulatory Visit
Admission: RE | Admit: 2013-09-09 | Discharge: 2013-09-09 | Disposition: A | Discharge status: Home / Self Care | Payer: No Typology Code available for payment source | Source: Ambulatory Visit

## 2013-09-09 ENCOUNTER — Encounter (INDEPENDENT_AMBULATORY_CARE_PROVIDER_SITE_OTHER): Payer: Self-pay

## 2013-09-09 DIAGNOSIS — Z1231 Encounter for screening mammogram for malignant neoplasm of breast: Secondary | ICD-10-CM

## 2013-09-10 ENCOUNTER — Ambulatory Visit (INDEPENDENT_AMBULATORY_CARE_PROVIDER_SITE_OTHER): Payer: No Typology Code available for payment source | Admitting: Family Medicine

## 2013-09-10 ENCOUNTER — Encounter: Payer: Self-pay | Admitting: Family Medicine

## 2013-09-10 VITALS — BP 114/70 | Ht 66.0 in | Wt 187.0 lb

## 2013-09-10 DIAGNOSIS — J4520 Mild intermittent asthma, uncomplicated: Secondary | ICD-10-CM

## 2013-09-10 DIAGNOSIS — I872 Venous insufficiency (chronic) (peripheral): Secondary | ICD-10-CM

## 2013-09-10 DIAGNOSIS — I878 Other specified disorders of veins: Secondary | ICD-10-CM

## 2013-09-10 DIAGNOSIS — J45909 Unspecified asthma, uncomplicated: Secondary | ICD-10-CM

## 2013-09-10 MED ORDER — MONTELUKAST SODIUM 10 MG PO TABS: 10.0000 mg | ORAL_TABLET | Freq: Every day | ORAL | Status: DC

## 2013-09-10 NOTE — Progress Notes (Signed)
   Subjective:    Patient ID: Olivia Huffman, female    DOB: Oct 25, 1955, 58 y.o.   MRN: [id number]  HPILeft foot swelling after having surgery on left leg. Had melanoma removed in July.   Mid July no hx of swelling before then .  Patient had a fairly extensive excision for melanoma.  Pathology came back as margins clear.  Will be screened regularly from here on out and patient to comply with this  Patient notes slight tenderness and discomfort at site of surgery. Also persistent swelling of the left leg.  No history of substantial swelling prior to surgery    Review of Systems No headache no chest pain no back pain no abdominal pain no shortness of breath ROS otherwise negative    Objective:   Physical Exam  Alert no apparent distress. Lungs clear. Heart rare rhythm H&T normal. Surgical incision left anterior leg appears to be healing well. Some postsurgical inflammatory changes persists. Mild tenderness at wound bed. Mild pitting edema present distally. Legs reveal bilateral prominent venules      Assessment & Plan:  We will impression post surgical inflammation still resolving. Discussed at length. Compromised somewhat by venous stasis which accentuates edema discussed plan 25 minutes spent most in discussion. No further recommendations at this time other than regular exercise. Asthma also clinically stable at this time. May eventually benefit from compression stockings but patient disinclined to use at this time. Patient needs refill on asthma medication. WSL

## 2013-09-13 ENCOUNTER — Encounter: Payer: Self-pay | Admitting: Family Medicine

## 2013-09-13 DIAGNOSIS — I878 Other specified disorders of veins: Secondary | ICD-10-CM

## 2013-09-13 HISTORY — DX: Other specified disorders of veins: I87.8

## 2013-09-29 ENCOUNTER — Encounter: Payer: Self-pay | Admitting: Gastroenterology

## 2014-02-22 ENCOUNTER — Encounter: Payer: Self-pay | Admitting: Family Medicine

## 2014-02-22 ENCOUNTER — Ambulatory Visit (INDEPENDENT_AMBULATORY_CARE_PROVIDER_SITE_OTHER): Payer: 59 | Admitting: Family Medicine

## 2014-02-22 VITALS — BP 130/90 | Temp 98.7°F | Ht 66.0 in | Wt 188.0 lb

## 2014-02-22 DIAGNOSIS — M545 Low back pain, unspecified: Secondary | ICD-10-CM

## 2014-02-22 MED ORDER — CHLORZOXAZONE 500 MG PO TABS: 500.0000 mg | ORAL_TABLET | Freq: Three times a day (TID) | ORAL | Status: DC | PRN

## 2014-02-22 MED ORDER — ETODOLAC 400 MG PO TABS: 400.0000 mg | ORAL_TABLET | Freq: Two times a day (BID) | ORAL | Status: DC

## 2014-02-22 NOTE — Progress Notes (Signed)
   Subjective:    Patient ID: Olivia Huffman, female    DOB: 1955-10-11, 59 y.o.   MRN: [id number]  Flank Pain This is a new problem. Episode onset: Saturday. The problem has been gradually improving since onset. The pain is present in the sacro-iliac. Quality: dull. The pain is the same all the time. The symptoms are aggravated by position. Associated symptoms include abdominal pain and headaches. Treatments tried: AZO and Tylenol. The treatment provided mild relief.   RIGHT SIDED NO DYSURIA  RAD TO RIGHT FLANK AND UNCOMFORTABLE  STARTED sat  Deep ache worse in cdrtain positions   incr with change posion  On [employer] sat  Still working and no t exdrcising   incr with motion no rad to leg        Review of Systems  Gastrointestinal: Positive for abdominal pain.  Genitourinary: Positive for flank pain.  Neurological: Positive for headaches.       Objective:   Physical Exam alert slight discomfort with motion. HEENT normal. Lungs clear heart rare rhythm no CVA tenderness positive right lumbar tenderness agonist straight leg raise  Urinalysis negative    Assessment & Plan:  Impression lumbar strain on further history left grandkids earlier in the weekend. Plan local measures discussed. Anti-inflammatory medicine and spasm medicine prescribed no x-rays rationale discussed WSL

## 2014-06-15 ENCOUNTER — Telehealth: Payer: Self-pay | Admitting: Family Medicine

## 2014-06-15 MED ORDER — NABUMETONE 750 MG PO TABS: 750.0000 mg | ORAL_TABLET | Freq: Two times a day (BID) | ORAL | Status: DC | PRN

## 2014-06-15 NOTE — Telephone Encounter (Signed)
Notified patient med was sent to pharmacy. Recheck in office if persists. Patient verbalized understanding.

## 2014-06-15 NOTE — Telephone Encounter (Signed)
Pt was seen recently for right sided pain and was prescribed a muscle relaxer. Pt states the pain has returned this past sat and the muscle relaxers are not helping the pain. Pt states it is not a constant pain that it is when she bends over.    [facility] [employer] 

## 2014-06-15 NOTE — Telephone Encounter (Signed)
Last seen 02/22/14 prescribed Etodolac, Chlorzoxazone

## 2014-06-15 NOTE — Telephone Encounter (Signed)
Try diff antiinflam relafen 750 one bid prn numb 24, re ck in off if persisit

## 2014-06-29 ENCOUNTER — Ambulatory Visit: Payer: 59 | Admitting: Family Medicine

## 2014-06-29 ENCOUNTER — Telehealth: Payer: Self-pay | Admitting: Family Medicine

## 2014-06-29 NOTE — Telephone Encounter (Signed)
Notified patient that the doctor will have to assess her first to make sure an ultrasound is needed and to determine what area exactly needs to be scanned. Patient states that she will call back later today to reschedule her appointment for sooner than Friday.

## 2014-06-29 NOTE — Telephone Encounter (Signed)
Pt called wanting to know if an ultrasound can go ahead a be ordered before her appt on Fri so that she can go ahead and get it done with before hand. Pt states that she is going out of town on Fri for a week and would like to have everything done before then.

## 2014-06-30 ENCOUNTER — Encounter: Payer: Self-pay | Admitting: Family Medicine

## 2014-06-30 ENCOUNTER — Ambulatory Visit (INDEPENDENT_AMBULATORY_CARE_PROVIDER_SITE_OTHER): Payer: 59 | Admitting: Family Medicine

## 2014-06-30 VITALS — BP 130/82 | Temp 98.2°F | Ht 66.0 in | Wt 185.0 lb

## 2014-06-30 DIAGNOSIS — R109 Unspecified abdominal pain: Secondary | ICD-10-CM | POA: Diagnosis not present

## 2014-06-30 DIAGNOSIS — R1031 Right lower quadrant pain: Secondary | ICD-10-CM

## 2014-06-30 DIAGNOSIS — G8929 Other chronic pain: Secondary | ICD-10-CM | POA: Diagnosis not present

## 2014-06-30 NOTE — Progress Notes (Signed)
   Subjective:    Patient ID: Olivia Huffman, female    DOB: 05-09-1955, 59 y.o.   MRN: [id number]  HPI Discomfort got better, went away for a few weeks  Got to feeling more discomfort  And took the higher dose of antiinflam  RLQ discomfort mainly and radiates to ththe back  Last colonoscopt three yrs ago, cked aoout well   Went to urgent care, they did reg x ray and blood test to ck liver and did a u a and it was neg. No new meds  Glu slightly h on b w   Patient arrives for a follow up from urgent care for lower right side pain. Patient states that her b/w, urine and xrays come back normal  But she is having continuing pain.pain is bothering the patient now daytime and night. Right lower quadrant.    Some nausea. No fever or chills.Somewhat diminished energy.diminished appetite.  Urinating has some urgency at times with pressure  Seems to be going to urinate more,but had normal urinalysis and negative culture.  Thirsty a lot , crampoing at times, and then loose stools. Last colonoscopy 3 years ago.   Review of Systems No headache no chest pain some right flank pain no rash    Objective:   Physical Exam Alert vitals stable. Lungs clear. Heart regular in rhythm. No CVA tenderness. Abdomen distinct right lower quadrant tenderness. No rebound no guarding. No inguinal tenderness. Hip good range of motion       Assessment & Plan:  Impression progressive right lower quadrant pain with patient very concerned about etiology. Radiating to flank. Progressive and worsening. Plan CT scan abdomen and pelvis. Warning signs discussed. If worsening go to emergency room. 40 minutes spent primarily in discussion, discussion of results, and discussion of referral and analysis results WSL

## 2014-07-01 ENCOUNTER — Ambulatory Visit (HOSPITAL_COMMUNITY)
Admission: RE | Admit: 2014-07-01 | Discharge: 2014-07-01 | Disposition: A | Discharge status: Home / Self Care | Payer: 59 | Source: Ambulatory Visit | Attending: Family Medicine | Admitting: Family Medicine

## 2014-07-01 DIAGNOSIS — R1031 Right lower quadrant pain: Secondary | ICD-10-CM | POA: Insufficient documentation

## 2014-07-01 MED ORDER — IOHEXOL 300 MG/ML  SOLN
100.0000 mL | Freq: Once | INTRAMUSCULAR | Status: AC | PRN
  Administered 2014-07-01: 100 mL via INTRAVENOUS

## 2014-07-01 MED ORDER — SODIUM CHLORIDE 0.9 % IJ SOLN
INTRAMUSCULAR | Status: AC
  Filled 2014-07-01: qty 36

## 2014-07-02 ENCOUNTER — Ambulatory Visit: Payer: 59 | Admitting: Family Medicine

## 2014-07-02 ENCOUNTER — Telehealth: Payer: Self-pay | Admitting: Family Medicine

## 2014-07-02 NOTE — Telephone Encounter (Signed)
Discussed with patient. (see result note)

## 2014-07-02 NOTE — Telephone Encounter (Signed)
If you get the results for her scan please call her at [phone]

## 2014-07-02 NOTE — Progress Notes (Signed)
Usually pain like this is out in the abdominal wall and groin wall muscle when the scan is this normal but with pt asking let her know, sure, she needs to get utd with her yrly gyn exam and we will be happy to do GI ref to her GI doc of choice

## 2014-09-19 ENCOUNTER — Other Ambulatory Visit: Payer: Self-pay | Admitting: Family Medicine

## 2015-01-27 ENCOUNTER — Other Ambulatory Visit: Payer: Self-pay

## 2015-01-27 DIAGNOSIS — Z1231 Encounter for screening mammogram for malignant neoplasm of breast: Secondary | ICD-10-CM

## 2015-02-09 ENCOUNTER — Ambulatory Visit
Admission: RE | Admit: 2015-02-09 | Discharge: 2015-02-09 | Disposition: A | Discharge status: Home / Self Care | Payer: 59 | Source: Ambulatory Visit

## 2015-02-09 DIAGNOSIS — Z1231 Encounter for screening mammogram for malignant neoplasm of breast: Secondary | ICD-10-CM

## 2015-09-20 ENCOUNTER — Other Ambulatory Visit: Payer: Self-pay | Admitting: Family Medicine

## 2015-09-20 NOTE — Telephone Encounter (Signed)
One rx, no ref, needs yrly o v for [place] to [facility]

## 2015-10-20 ENCOUNTER — Other Ambulatory Visit: Payer: Self-pay | Admitting: Family Medicine

## 2015-10-21 NOTE — Telephone Encounter (Signed)
Not seen over one yer, one mo worth needs appt

## 2015-11-18 ENCOUNTER — Other Ambulatory Visit: Payer: Self-pay | Admitting: Family Medicine

## 2015-12-07 ENCOUNTER — Telehealth: Payer: Self-pay | Admitting: Family Medicine

## 2015-12-07 ENCOUNTER — Other Ambulatory Visit: Payer: Self-pay | Admitting: Family Medicine

## 2015-12-07 NOTE — Telephone Encounter (Signed)
Patient asked me to forward this message to Dr. Richardson Landry to see when he gets back because she feels she doesn't need an appointment because of what he told her at her last visit.

## 2015-12-07 NOTE — Telephone Encounter (Signed)
Patient has not been in office in 18 months (06/2014)and that is why it was cut down to 15- needs office visit once a year for asthma/allergies

## 2015-12-07 NOTE — Telephone Encounter (Signed)
Patient requesting Rx for montelukast (SINGULAIR) 10 MG tablet.  She said that the last time it was sent in that it was for only 15 pills.  She says that Dr. Richardson Landry told her the last time she came in the office that she didn't need an appointment to have this particular medication filled.    [place]

## 2015-12-07 NOTE — Telephone Encounter (Signed)
Spoke with patient and informed her that Dr.Steve's last note stated recheck in a year from last visit for maintenance and medication. Patient verbalized understanding and was transferred to the front desk to schedule office visit.

## 2015-12-07 NOTE — Telephone Encounter (Addendum)
Patient has not been in office in 18 months (06/2014)and that is why it was cut down to 15- needs office visit once a year for asthma/allergies per dictation(states check yearly)-Patient states she was told she did not need follow up office visits for refills

## 2015-12-07 NOTE — Telephone Encounter (Signed)
Left message to return call- patient needs yearly check up for maintenance of med(per Dr Jeannine Kitten note) -not seen in 18 months- Needs office visit

## 2015-12-12 ENCOUNTER — Other Ambulatory Visit: Payer: Self-pay | Admitting: *Deleted

## 2015-12-12 MED ORDER — MONTELUKAST SODIUM 10 MG PO TABS: 10.0000 mg | ORAL_TABLET | Freq: Every day | ORAL | 0 refills | Status: DC

## 2015-12-12 NOTE — Telephone Encounter (Signed)
Tell pt sorry but [facility] policy is that folks on prescription meds should be seen yrly for proper monitoring, give her ninety days woth and let her know will need o v before further rx's

## 2015-12-12 NOTE — Telephone Encounter (Signed)
Left message to return call. 90 day supply sent to pharm. Needs office visit before further refills.

## 2015-12-13 NOTE — Telephone Encounter (Signed)
Discussed with pt

## 2016-01-10 ENCOUNTER — Ambulatory Visit: Payer: 59 | Admitting: Family Medicine

## 2016-01-26 ENCOUNTER — Ambulatory Visit (INDEPENDENT_AMBULATORY_CARE_PROVIDER_SITE_OTHER): Payer: 59 | Admitting: Family Medicine

## 2016-01-26 ENCOUNTER — Encounter: Payer: Self-pay | Admitting: Family Medicine

## 2016-01-26 VITALS — BP 128/82 | Ht 66.0 in | Wt 190.2 lb

## 2016-01-26 DIAGNOSIS — J4520 Mild intermittent asthma, uncomplicated: Secondary | ICD-10-CM

## 2016-01-26 DIAGNOSIS — J301 Allergic rhinitis due to pollen: Secondary | ICD-10-CM | POA: Diagnosis not present

## 2016-01-26 MED ORDER — MONTELUKAST SODIUM 10 MG PO TABS
10.0000 mg | ORAL_TABLET | Freq: Every day | ORAL | 3 refills | Status: DC
Start: 2016-01-26 — End: 2017-02-01

## 2016-01-26 NOTE — Progress Notes (Signed)
   Subjective:    Patient ID: Olivia Huffman, female    DOB: [birth year], 61 y.o.   MRN: [id number]  HPI Patient arrives for a follow up on asthma. Patient reports no problems and concerns.  Pt oin singulair,  Pt noted wheezing difficulties in the past in cold air, fumes, dust would trigger wheezing  Exercise not so   , g boro now working daily    No need for inhaler for years  No alleriew when taking meds, now a lot better      No sig side effects   Review of Systems No headache, no major weight loss or weight gain, no chest pain no back pain abdominal pain no change in bowel habits complete ROS otherwise negative     Objective:   Physical Exam  Alert vitals stable, NAD. Blood pressure good on repeat. HEENT normal. Lungs clear. Heart regular rate and rhythm.       Assessment & Plan:  Impression chronic allergic rhinitis and irritant induced asthma with excellent control via Singulair plan maintain Singulair. Parameters for potential weaning at one point discussed diet exercise discussed

## 2016-02-01 ENCOUNTER — Ambulatory Visit (INDEPENDENT_AMBULATORY_CARE_PROVIDER_SITE_OTHER): Payer: 59 | Admitting: Family Medicine

## 2016-02-01 ENCOUNTER — Encounter: Payer: Self-pay | Admitting: Family Medicine

## 2016-02-01 VITALS — BP 130/80 | Temp 98.1°F | Ht 66.0 in | Wt 188.2 lb

## 2016-02-01 DIAGNOSIS — J329 Chronic sinusitis, unspecified: Secondary | ICD-10-CM | POA: Diagnosis not present

## 2016-02-01 DIAGNOSIS — J310 Chronic rhinitis: Secondary | ICD-10-CM

## 2016-02-01 DIAGNOSIS — J029 Acute pharyngitis, unspecified: Secondary | ICD-10-CM | POA: Diagnosis not present

## 2016-02-01 MED ORDER — AZITHROMYCIN 250 MG PO TABS: ORAL_TABLET | ORAL | 0 refills | Status: DC

## 2016-02-01 NOTE — Progress Notes (Signed)
   Subjective:    Patient ID: Olivia Huffman, female    DOB: 1955-06-04, 61 y.o.   MRN: [id number]  URI   This is a new problem. The current episode started in the past 7 days. The problem has been unchanged. There has been no fever. Associated symptoms include congestion, coughing, ear pain and a sore throat. She has tried acetaminophen (Robitussin) for the symptoms. The treatment provided no relief.   Patient has no other concerns at this time.   Very bad sore throat g son had strep   Cough all in the chest ff and on, coughing up gunky stuff and green  No sig fever  No ig achiness   felt bad Energy zero    Review of Systems  HENT: Positive for congestion, ear pain and sore throat.   Respiratory: Positive for cough.        Objective:   Physical Exam Alert active good hydration H&T moderate nasal congestion pharynx erythematous bronchial cough no wheezes currently heart regular in rhythm       Assessment & Plan:  Post viral rhinosinusitis/bronchitis and pharyngitis plan antibiotics prescribed. Positive strep exposure but I think this started with flu symptom care discussed warning signs discussed

## 2016-03-16 ENCOUNTER — Encounter: Payer: Self-pay | Admitting: Nurse Practitioner

## 2016-03-16 ENCOUNTER — Ambulatory Visit (INDEPENDENT_AMBULATORY_CARE_PROVIDER_SITE_OTHER): Payer: 59 | Admitting: Nurse Practitioner

## 2016-03-16 VITALS — BP 124/88 | Temp 98.0°F | Ht 66.0 in | Wt 190.0 lb

## 2016-03-16 DIAGNOSIS — J310 Chronic rhinitis: Secondary | ICD-10-CM

## 2016-03-16 DIAGNOSIS — J209 Acute bronchitis, unspecified: Secondary | ICD-10-CM

## 2016-03-16 DIAGNOSIS — J329 Chronic sinusitis, unspecified: Secondary | ICD-10-CM

## 2016-03-16 MED ORDER — ALBUTEROL SULFATE HFA 108 (90 BASE) MCG/ACT IN AERS: 2.0000 | INHALATION_SPRAY | RESPIRATORY_TRACT | 0 refills | Status: AC | PRN

## 2016-03-16 MED ORDER — AMOXICILLIN-POT CLAVULANATE 875-125 MG PO TABS: 1.0000 | ORAL_TABLET | Freq: Two times a day (BID) | ORAL | 0 refills | Status: DC

## 2016-03-17 ENCOUNTER — Encounter: Payer: Self-pay | Admitting: Nurse Practitioner

## 2016-03-17 NOTE — Progress Notes (Signed)
Subjective:  Presents for c/o an asthma flare up. Last seen 1/31. Has had anterior upper chest congestion since then. Non productive cough. No wheezing. Albuterol inhaler is out of date. Frequent cough. Slight chest tightness with deep breath or cough. No sore throat, headache, runny nose or ear pain. Taking Singulair daily. No fevers. Non smoker. No acid reflux, nausea or abd pain.   Objective:   BP 124/88   Temp 98 F (36.7 C) (Oral)   Ht 5\' 6"  (1.676 m)   Wt 190 lb (86.2 kg)   BMI 30.67 kg/m  NAD. Alert, oriented. TMs retracted bilat. No erythema. Pharynx injected with PND noted. Occasional congested cough noted. Frequent clearing of the throat. Neck supple with mild anterior adenopathy. Lungs scattered expiratory crackles along left side, clear on the right. No wheezing or tachypnea. Heart RRR. Abdomen soft, non tender.   Assessment:  Rhinosinusitis  Acute bronchitis, unspecified organism    Plan:   Meds ordered this encounter  Medications  . amoxicillin-clavulanate (AUGMENTIN) 875-125 MG tablet    Sig: Take 1 tablet by mouth 2 (two) times daily.    Dispense:  20 tablet    Refill:  0    Order Specific Question:   Supervising Provider    Answer:   Mikey Kirschner [[id number]]  . albuterol (PROVENTIL HFA;VENTOLIN HFA) 108 (90 Base) MCG/ACT inhaler    Sig: Inhale 2 puffs into the lungs every 4 (four) hours as needed.    Dispense:  1 Inhaler    Refill:  0    Order Specific Question:   Supervising Provider    Answer:   Mikey Kirschner [[id number]]   OTC meds as directed for cough and congestion. Call back in 10-14 days if cough persists. Plan chest xray at that time. Call back sooner if worse.

## 2016-08-07 ENCOUNTER — Other Ambulatory Visit: Payer: Self-pay | Admitting: Orthopedic Surgery

## 2016-08-07 DIAGNOSIS — L608 Other nail disorders: Secondary | ICD-10-CM

## 2016-08-07 DIAGNOSIS — L609 Nail disorder, unspecified: Secondary | ICD-10-CM

## 2016-08-18 ENCOUNTER — Other Ambulatory Visit: Payer: 59

## 2016-08-19 ENCOUNTER — Ambulatory Visit
Admission: RE | Admit: 2016-08-19 | Discharge: 2016-08-19 | Disposition: A | Discharge status: Home / Self Care | Payer: 59 | Source: Ambulatory Visit | Attending: Orthopedic Surgery | Admitting: Orthopedic Surgery

## 2016-08-19 DIAGNOSIS — L608 Other nail disorders: Secondary | ICD-10-CM

## 2016-08-19 DIAGNOSIS — L609 Nail disorder, unspecified: Secondary | ICD-10-CM

## 2016-08-28 ENCOUNTER — Other Ambulatory Visit: Payer: Self-pay | Admitting: Orthopedic Surgery

## 2016-08-29 ENCOUNTER — Encounter (HOSPITAL_BASED_OUTPATIENT_CLINIC_OR_DEPARTMENT_OTHER): Payer: Self-pay | Admitting: *Deleted

## 2016-08-30 ENCOUNTER — Encounter (HOSPITAL_BASED_OUTPATIENT_CLINIC_OR_DEPARTMENT_OTHER): Payer: Self-pay | Admitting: *Deleted

## 2016-09-06 ENCOUNTER — Ambulatory Visit (HOSPITAL_BASED_OUTPATIENT_CLINIC_OR_DEPARTMENT_OTHER)
Admission: RE | Admit: 2016-09-06 | Discharge: 2016-09-06 | Disposition: A | Discharge status: Home / Self Care | Payer: 59 | Source: Ambulatory Visit | Attending: Orthopedic Surgery | Admitting: Orthopedic Surgery

## 2016-09-06 ENCOUNTER — Ambulatory Visit (HOSPITAL_BASED_OUTPATIENT_CLINIC_OR_DEPARTMENT_OTHER): Payer: 59 | Admitting: Anesthesiology

## 2016-09-06 ENCOUNTER — Encounter (HOSPITAL_BASED_OUTPATIENT_CLINIC_OR_DEPARTMENT_OTHER)
Admission: RE | Disposition: A | Discharge status: Home / Self Care | Payer: Self-pay | Source: Ambulatory Visit | Attending: Orthopedic Surgery

## 2016-09-06 ENCOUNTER — Encounter (HOSPITAL_BASED_OUTPATIENT_CLINIC_OR_DEPARTMENT_OTHER): Payer: Self-pay | Admitting: Anesthesiology

## 2016-09-06 DIAGNOSIS — Z882 Allergy status to sulfonamides status: Secondary | ICD-10-CM | POA: Insufficient documentation

## 2016-09-06 DIAGNOSIS — L608 Other nail disorders: Secondary | ICD-10-CM | POA: Diagnosis not present

## 2016-09-06 DIAGNOSIS — I10 Essential (primary) hypertension: Secondary | ICD-10-CM | POA: Insufficient documentation

## 2016-09-06 DIAGNOSIS — J45909 Unspecified asthma, uncomplicated: Secondary | ICD-10-CM | POA: Diagnosis not present

## 2016-09-06 DIAGNOSIS — Z85820 Personal history of malignant melanoma of skin: Secondary | ICD-10-CM | POA: Insufficient documentation

## 2016-09-06 DIAGNOSIS — Z79899 Other long term (current) drug therapy: Secondary | ICD-10-CM | POA: Diagnosis not present

## 2016-09-06 DIAGNOSIS — L728 Other follicular cysts of the skin and subcutaneous tissue: Secondary | ICD-10-CM | POA: Insufficient documentation

## 2016-09-06 DIAGNOSIS — M19042 Primary osteoarthritis, left hand: Secondary | ICD-10-CM | POA: Insufficient documentation

## 2016-09-06 DIAGNOSIS — D4989 Neoplasm of unspecified behavior of other specified sites: Secondary | ICD-10-CM | POA: Diagnosis present

## 2016-09-06 HISTORY — DX: Malignant (primary) neoplasm, unspecified: C80.1

## 2016-09-06 HISTORY — PX: MASS EXCISION: SHX2000

## 2016-09-06 SURGERY — EXCISION MASS
Anesthesia: Regional | Site: Thumb | Laterality: Left

## 2016-09-06 MED ORDER — FENTANYL CITRATE (PF) 100 MCG/2ML IJ SOLN: INTRAMUSCULAR | Status: DC | PRN

## 2016-09-06 MED ORDER — CHLORHEXIDINE GLUCONATE 4 % EX LIQD: 60.0000 mL | Freq: Once | CUTANEOUS | Status: DC

## 2016-09-06 MED ORDER — MIDAZOLAM HCL 2 MG/2ML IJ SOLN
INTRAMUSCULAR | Status: AC
  Filled 2016-09-06: qty 2

## 2016-09-06 MED ORDER — CEFAZOLIN SODIUM-DEXTROSE 2-4 GM/100ML-% IV SOLN
2.0000 g | INTRAVENOUS | Status: AC
  Administered 2016-09-06: 2 g via INTRAVENOUS

## 2016-09-06 MED ORDER — PROMETHAZINE HCL 25 MG/ML IJ SOLN: 6.2500 mg | INTRAMUSCULAR | Status: DC | PRN

## 2016-09-06 MED ORDER — MIDAZOLAM HCL 5 MG/5ML IJ SOLN
INTRAMUSCULAR | Status: DC | PRN
  Administered 2016-09-06: 2 mg via INTRAVENOUS

## 2016-09-06 MED ORDER — HYDROMORPHONE HCL 1 MG/ML IJ SOLN: 0.2500 mg | INTRAMUSCULAR | Status: DC | PRN

## 2016-09-06 MED ORDER — OXYCODONE HCL 5 MG/5ML PO SOLN: 5.0000 mg | Freq: Once | ORAL | Status: DC | PRN

## 2016-09-06 MED ORDER — PROPOFOL 10 MG/ML IV BOLUS
INTRAVENOUS | Status: DC | PRN
  Administered 2016-09-06: 200 mg via INTRAVENOUS
  Administered 2016-09-06: 50 mg via INTRAVENOUS

## 2016-09-06 MED ORDER — MEPERIDINE HCL 25 MG/ML IJ SOLN: 6.2500 mg | INTRAMUSCULAR | Status: DC | PRN

## 2016-09-06 MED ORDER — BUPIVACAINE HCL (PF) 0.5 % IJ SOLN
INTRAMUSCULAR | Status: DC | PRN
  Administered 2016-09-06: 9 mL

## 2016-09-06 MED ORDER — LACTATED RINGERS IV SOLN
INTRAVENOUS | Status: DC | PRN
  Administered 2016-09-06 (×2): via INTRAVENOUS

## 2016-09-06 MED ORDER — LIDOCAINE HCL (CARDIAC) 20 MG/ML IV SOLN
INTRAVENOUS | Status: DC | PRN
  Administered 2016-09-06: 30 mg via INTRAVENOUS

## 2016-09-06 MED ORDER — HYDROCODONE-ACETAMINOPHEN 5-325 MG PO TABS: 1.0000 | ORAL_TABLET | Freq: Four times a day (QID) | ORAL | 0 refills | Status: DC | PRN

## 2016-09-06 MED ORDER — CEFAZOLIN SODIUM-DEXTROSE 2-4 GM/100ML-% IV SOLN
INTRAVENOUS | Status: AC
  Filled 2016-09-06: qty 100

## 2016-09-06 MED ORDER — FENTANYL CITRATE (PF) 100 MCG/2ML IJ SOLN
INTRAMUSCULAR | Status: AC
  Filled 2016-09-06: qty 2

## 2016-09-06 MED ORDER — DEXAMETHASONE SODIUM PHOSPHATE 4 MG/ML IJ SOLN
INTRAMUSCULAR | Status: DC | PRN
  Administered 2016-09-06: 10 mg via INTRAVENOUS

## 2016-09-06 MED ORDER — OXYCODONE HCL 5 MG PO TABS: 5.0000 mg | ORAL_TABLET | Freq: Once | ORAL | Status: DC | PRN

## 2016-09-06 SURGICAL SUPPLY — 47 items
BANDAGE COBAN STERILE 2 (GAUZE/BANDAGES/DRESSINGS) IMPLANT
BLADE SURG 15 STRL LF DISP TIS (BLADE) ×1 IMPLANT
BLADE SURG 15 STRL SS (BLADE) ×2
BNDG CMPR 9X4 STRL LF SNTH (GAUZE/BANDAGES/DRESSINGS) ×1
BNDG COHESIVE 1X5 TAN STRL LF (GAUZE/BANDAGES/DRESSINGS) ×1 IMPLANT
BNDG COHESIVE 3X5 TAN STRL LF (GAUZE/BANDAGES/DRESSINGS) IMPLANT
BNDG ESMARK 4X9 LF (GAUZE/BANDAGES/DRESSINGS) ×1 IMPLANT
BNDG GAUZE ELAST 4 BULKY (GAUZE/BANDAGES/DRESSINGS) IMPLANT
CHLORAPREP W/TINT 26ML (MISCELLANEOUS) ×2 IMPLANT
CORD BIPOLAR FORCEPS 12FT (ELECTRODE) ×2 IMPLANT
COVER BACK TABLE 60X90IN (DRAPES) ×2 IMPLANT
COVER MAYO STAND STRL (DRAPES) ×2 IMPLANT
CUFF TOURNIQUET SINGLE 18IN (TOURNIQUET CUFF) ×1 IMPLANT
DECANTER SPIKE VIAL GLASS SM (MISCELLANEOUS) IMPLANT
DRAIN PENROSE 1/2X12 LTX STRL (WOUND CARE) IMPLANT
DRAPE EXTREMITY T 121X128X90 (DRAPE) ×2 IMPLANT
DRAPE SURG 17X23 STRL (DRAPES) ×2 IMPLANT
GAUZE SPONGE 4X4 12PLY STRL (GAUZE/BANDAGES/DRESSINGS) ×2 IMPLANT
GAUZE XEROFORM 1X8 LF (GAUZE/BANDAGES/DRESSINGS) ×2 IMPLANT
GLOVE BIO SURGEON STRL SZ7 (GLOVE) ×2 IMPLANT
GLOVE BIOGEL PI IND STRL 8.5 (GLOVE) ×1 IMPLANT
GLOVE BIOGEL PI INDICATOR 8.5 (GLOVE) ×1
GLOVE EXAM NITRILE MD LF STRL (GLOVE) ×1 IMPLANT
GLOVE SURG ORTHO 8.0 STRL STRW (GLOVE) ×2 IMPLANT
GOWN STRL REUS W/ TWL LRG LVL3 (GOWN DISPOSABLE) ×1 IMPLANT
GOWN STRL REUS W/ TWL XL LVL3 (GOWN DISPOSABLE) IMPLANT
GOWN STRL REUS W/TWL LRG LVL3 (GOWN DISPOSABLE)
GOWN STRL REUS W/TWL XL LVL3 (GOWN DISPOSABLE) ×6 IMPLANT
NDL PRECISIONGLIDE 27X1.5 (NEEDLE) ×1 IMPLANT
NDL SAFETY ECLIPSE 18X1.5 (NEEDLE) IMPLANT
NEEDLE HYPO 18GX1.5 SHARP (NEEDLE)
NEEDLE PRECISIONGLIDE 27X1.5 (NEEDLE) ×2 IMPLANT
NS IRRIG 1000ML POUR BTL (IV SOLUTION) ×2 IMPLANT
PACK BASIN DAY SURGERY FS (CUSTOM PROCEDURE TRAY) ×2 IMPLANT
PAD CAST 3X4 CTTN HI CHSV (CAST SUPPLIES) IMPLANT
PADDING CAST COTTON 3X4 STRL (CAST SUPPLIES)
SPLINT FINGER 4.25 BULB 911906 (SOFTGOODS) ×1 IMPLANT
SPLINT PLASTER CAST XFAST 3X15 (CAST SUPPLIES) IMPLANT
SPLINT PLASTER XTRA FASTSET 3X (CAST SUPPLIES)
STOCKINETTE 4X48 STRL (DRAPES) ×2 IMPLANT
SUT CHROMIC 6 0 PS 4 (SUTURE) ×1 IMPLANT
SUT ETHILON 4 0 PS 2 18 (SUTURE) ×2 IMPLANT
SUT VIC AB 4-0 P2 18 (SUTURE) IMPLANT
SYR BULB 3OZ (MISCELLANEOUS) ×2 IMPLANT
SYR CONTROL 10ML LL (SYRINGE) ×2 IMPLANT
TOWEL OR 17X24 6PK STRL BLUE (TOWEL DISPOSABLE) ×2 IMPLANT
UNDERPAD 30X30 (UNDERPADS AND DIAPERS) ×2 IMPLANT

## 2016-09-06 NOTE — Op Note (Signed)
Dictation Number [id number]

## 2016-09-06 NOTE — H&P (Signed)
  Olivia Huffman is an 61 y.o. female.   Chief Complaint: mass left thumb nailbed HPI: Olivia Huffman is a 61 year old right-hand-dominant female referred by her register for consultation regarding a mass under the nail plate of her left thumb. This is at the proximal nail fold. This been present for approximately 2 months according to her. She has no history of injury. She is having some pain in her wrist from this. She states that this is a tight sensation with a VAS score 7/10 at the wrist. She has taken ibuprofen which has helped. Has no history diabetes thyroid problems arthritis or gout. She is not complaining  She has had her MRI done and read out by Dr. Posey Pronto. It is noted in the report that she has [place] arthritis. He makes no mention of a tumor. The MRI is reviewed revealing a definite tumor in the nail matrix of her left thumb this present         Past Medical History:  Diagnosis Date  . Arthritis   . Constipation   . Perennial allergic rhinitis   . Pneumonia    hx of  . Reactive airway disease   . Venous stasis 09/13/2013    Past Surgical History:  Procedure Laterality Date  . COLONOSCOPY    . LESION REMOVAL Left 07/16/2013   Procedure: EXCISE MELANOMA OF LOWER LEFT LEG;  Surgeon: Crissie Reese, MD;  Location: [facility];  Service: Plastics;  Laterality: Left;    Family History  Problem Relation Age of Onset  . Diabetes Mother   . Hypertension Mother    Social History:  reports that she has never smoked. She has never used smokeless tobacco. She reports that she drinks alcohol. She reports that she does not use drugs.  Allergies:  Allergies  Allergen Reactions  . Sulfonamide Derivatives     REACTION: hives    No prescriptions prior to admission.    No results found for this or any previous visit (from the past 48 hour(s)).  No results found.   Pertinent items are noted in HPI.  Height 5\' 6"  (1.676 m), weight 86.2 kg (190 lb).  General appearance: alert,  cooperative and appears stated age Head: Normocephalic, without obvious abnormality Neck: no JVD Resp: clear to auscultation bilaterally Cardio: regular rate and rhythm, S1, S2 normal, no murmur, click, rub or gallop GI: soft, non-tender; bowel sounds normal; no masses,  no organomegaly Extremities:  mass left thumb nailbed Pulses: 2+ and symmetric Skin: Skin color, texture, turgor normal. No rashes or lesions Neurologic: Grossly normal Incision/Wound: na  Assessment/Plan Assessment:  1. Nail deformity  2. Primary osteoarthritis of left hand    Plan: We have discussed the tumor along with her MRI with her. Would recommend excisional biopsy of the tumor from the left thumb prepare postoperative course are discussed along with risk complications. She is aware that there is no guarantee to the surgery the possibility of infection recurrence or injury to arteries nerves tendons this can be done under regional anesthesia. She is advised to possible potential for recurrence of the tumor nail plate deformity. Questions were encouraged and answered to her satisfaction. This will be scheduled as an outpatient under regional excisional biopsy tumor left thumb nail matrix.      Olivia Huffman R 09/06/2016, 9:00 AM

## 2016-09-06 NOTE — Brief Op Note (Signed)
09/06/2016  1:49 PM  PATIENT:  Olivia Huffman  61 y.o. female  PRE-OPERATIVE DIAGNOSIS:  TUMOR LEFT THUMB  POST-OPERATIVE DIAGNOSIS:  TUMOR LEFT THUMB  PROCEDURE:  Procedure(s) with comments: BIOPSY/EXCISION MASS NAILBED LEFT THUMB (Left) - REG/FAB  SURGEON:  Surgeon(s) and Role:    * Daryll Brod, MD - Primary  PHYSICIAN ASSISTANT:   ASSISTANTS: none   ANESTHESIA:   local and general  EBL:  Total I/O In: 1000 [I.V.:1000] Out: -   BLOOD ADMINISTERED:none  DRAINS: none   LOCAL MEDICATIONS USED:  BUPIVICAINE   SPECIMEN:  Excision  DISPOSITION OF SPECIMEN:  PATHOLOGY  COUNTS:  YES  TOURNIQUET:   Total Tourniquet Time Documented: Upper Arm (Left) - 12 minutes Total: Upper Arm (Left) - 12 minutes   DICTATION: .Other Dictation: Dictation Number [id number]  PLAN OF CARE: Discharge to home after PACU  PATIENT DISPOSITION:  PACU - hemodynamically stable.

## 2016-09-06 NOTE — Discharge Instructions (Addendum)

## 2016-09-06 NOTE — Anesthesia Procedure Notes (Signed)
Procedure Name: LMA Insertion Date/Time: 09/06/2016 1:24 PM Performed by: Toula Moos L Pre-anesthesia Checklist: Patient identified, Emergency Drugs available, Suction available, Patient being monitored and Timeout performed Patient Re-evaluated:Patient Re-evaluated prior to induction Oxygen Delivery Method: Circle system utilized Preoxygenation: Pre-oxygenation with 100% oxygen Induction Type: IV induction Ventilation: Mask ventilation without difficulty LMA: LMA inserted LMA Size: 4.0 Number of attempts: 1 Airway Equipment and Method: Bite block Placement Confirmation: positive ETCO2 Tube secured with: Tape Dental Injury: Teeth and Oropharynx as per pre-operative assessment

## 2016-09-06 NOTE — Transfer of Care (Signed)
Immediate Anesthesia Transfer of Care Note  Patient: Olivia Huffman  Procedure(s) Performed: Procedure(s) with comments: BIOPSY/EXCISION MASS NAILBED LEFT THUMB (Left) - REG/FAB  Patient Location: PACU  Anesthesia Type:General  Level of Consciousness: awake and patient cooperative  Airway & Oxygen Therapy: Patient Spontanous Breathing and Patient connected to face mask oxygen  Post-op Assessment: Report given to RN and Post -op Vital signs reviewed and stable  Post vital signs: Reviewed and stable  Last Vitals:  Vitals:   09/06/16 1244  BP: (!) 144/79  Pulse: 65  Resp: 16  Temp: 36.9 C  SpO2: 100%    Last Pain:  Vitals:   09/06/16 1244  TempSrc: Oral         Complications: No apparent anesthesia complications

## 2016-09-06 NOTE — Anesthesia Preprocedure Evaluation (Addendum)
Anesthesia Evaluation  Patient identified by MRN, date of birth, ID band Patient awake    Reviewed: Allergy & Precautions, H&P , NPO status , Patient's Chart, lab work & pertinent test results  Airway Mallampati: I  TM Distance: >3 FB Neck ROM: Full    Dental no notable dental hx.    Pulmonary asthma ,    Pulmonary exam normal breath sounds clear to auscultation       Cardiovascular hypertension, Pt. on medications Normal cardiovascular exam Rhythm:Regular Rate:Normal     Neuro/Psych    GI/Hepatic   Endo/Other    Renal/GU      Musculoskeletal  (+) Arthritis , Osteoarthritis,    Abdominal (+) + obese,   Peds  Hematology   Anesthesia Other Findings   Reproductive/Obstetrics                            Anesthesia Physical  Anesthesia Plan  ASA: II  Anesthesia Plan: General   Post-op Pain Management:    Induction: Intravenous  PONV Risk Score and Plan: 2 and Ondansetron and Midazolam  Airway Management Planned: LMA  Additional Equipment:   Intra-op Plan:   Post-operative Plan:   Informed Consent: I have reviewed the patients History and Physical, chart, labs and discussed the procedure including the risks, benefits and alternatives for the proposed anesthesia with the patient or authorized representative who has indicated his/her understanding and acceptance.     Plan Discussed with: CRNA and Surgeon  Anesthesia Plan Comments:        Anesthesia Quick Evaluation

## 2016-09-06 NOTE — Anesthesia Postprocedure Evaluation (Signed)
Anesthesia Post Note  Patient: Olivia Huffman  Procedure(s) Performed: Procedure(s) (LRB): BIOPSY/EXCISION MASS NAILBED LEFT THUMB (Left)     Patient location during evaluation: PACU Anesthesia Type: General Level of consciousness: awake and alert Pain management: pain level controlled Vital Signs Assessment: post-procedure vital signs reviewed and stable Respiratory status: spontaneous breathing, nonlabored ventilation and respiratory function stable Cardiovascular status: blood pressure returned to baseline and stable Postop Assessment: no signs of nausea or vomiting Anesthetic complications: no    Last Vitals:  Vitals:   09/06/16 1430 09/06/16 1448  BP: (!) 148/83 (!) 145/80  Pulse: 60 61  Resp: 15 16  Temp: 36.5 C 36.7 C  SpO2: 100% 100%    Last Pain:  Vitals:   09/06/16 1448  TempSrc:   PainSc: 0-No pain                 Lynda Rainwater

## 2016-09-07 ENCOUNTER — Encounter (HOSPITAL_BASED_OUTPATIENT_CLINIC_OR_DEPARTMENT_OTHER): Payer: Self-pay | Admitting: Orthopedic Surgery

## 2016-09-07 NOTE — Op Note (Signed)
[name], Olivia Huffman               ACCOUNT NO.:  [id number]  MEDICAL RECORD NO.:  [id number]  LOCATION:                                 FACILITY:  PHYSICIAN:  Daryll Brod, M.D.            DATE OF BIRTH:  DATE OF PROCEDURE:  09/06/2016 DATE OF DISCHARGE:                              OPERATIVE REPORT   PREOPERATIVE DIAGNOSIS:  Tumor, nail bed, left thumb.  POSTOPERATIVE DIAGNOSIS:  Tumor, nail bed, left thumb.  OPERATION:  Excisional biopsy of mass, nail bed, left thumb.  SURGEON:  Daryll Brod, M.D.  ASSISTANT:  None.  ANESTHESIA:  General with metacarpal block.  PLACE OF SURGERY:  [facility].  ANESTHESIOLOGIST:  Dr. Sabra Heck.  HISTORY:  The patient is a 61 year old female with a history of a tumor under the nail plate of her left thumb.  MRI reveals the tumor present. She is desirous of having this excised.  Pre, peri, and postoperative course have been discussed along with risks and complications.  She is aware there was no guarantee to the surgery with possibility of infection; recurrence of injury to arteries, nerves, tendons; incomplete relief of symptoms; dystrophy.  In the preoperative area, the patient is seen, the extremity marked by both the patient and surgeon.  Antibiotic given.  DESCRIPTION OF PROCEDURE:  The patient was brought to the operating room where a general anesthetic was carried out without difficulty and then IVs could not be started for an IV regional.  She was prepped using ChloraPrep in a supine position with left arm free.  A 3-minute dry time was allowed.  Time-out taken, confirming the patient and procedure.  The nail plate was removed.  After exsanguination of the limb with an Esmarch bandage, tourniquet placed high on the arm was inflated to 250 mmHg.  The limb was exsanguinated from the wrist proximally.  The nail plate having been removed, allowed visualization of the tumor.  A Beaver blade was then used to incise the nail matrix  allowing visualization of a white mass, which was dissected free with blunt and sharp dissection. The specimen was sent to Pathology, measured approximately 4 mm in diameter.  The area was further debrided of any extraneous tissue.  The area was irrigated.  The nail bed was then repaired with interrupted 6-0 chromic sutures.  The nail plate was debrided and reapplied into the nail fold proximally.  A sterile compressive dressing and splint to the thumb was applied.  Prior to initiation of removal of the nail plate, a metacarpal block was given with 0.25% bupivacaine without epinephrine, 9 mL was used.  On deflation of the tourniquet, remaining fingers pinked.  She was taken to the recovery room for observation in satisfactory condition.  She will be discharged to the home, return to the [facility] in 1 week, on Norco.          ______________________________ Daryll Brod, M.D.     GK/MEDQ  D:  09/06/2016  T:  09/07/2016  Job:  [id number]

## 2016-11-06 ENCOUNTER — Other Ambulatory Visit: Payer: Self-pay | Admitting: Family Medicine

## 2016-11-06 DIAGNOSIS — Z1231 Encounter for screening mammogram for malignant neoplasm of breast: Secondary | ICD-10-CM

## 2016-12-05 ENCOUNTER — Ambulatory Visit
Admission: RE | Admit: 2016-12-05 | Discharge: 2016-12-05 | Disposition: A | Discharge status: Home / Self Care | Payer: 59 | Source: Ambulatory Visit | Attending: Family Medicine | Admitting: Family Medicine

## 2016-12-05 DIAGNOSIS — Z1231 Encounter for screening mammogram for malignant neoplasm of breast: Secondary | ICD-10-CM

## 2017-02-01 ENCOUNTER — Encounter: Payer: Self-pay | Admitting: Family Medicine

## 2017-02-01 ENCOUNTER — Ambulatory Visit: Payer: 59 | Admitting: Family Medicine

## 2017-02-01 VITALS — BP 126/82 | Ht 66.0 in | Wt 186.6 lb

## 2017-02-01 DIAGNOSIS — J4520 Mild intermittent asthma, uncomplicated: Secondary | ICD-10-CM

## 2017-02-01 MED ORDER — MONTELUKAST SODIUM 10 MG PO TABS: 10.0000 mg | ORAL_TABLET | Freq: Every day | ORAL | 3 refills | Status: DC

## 2017-02-01 NOTE — Progress Notes (Signed)
   Subjective:    Patient ID: Olivia Huffman, female    DOB: 04-21-1955, 62 y.o.   MRN: [id number]  HPI Pt arrives today for a 12 month follow up on asthma. Pt states no concerns or problems.  singulair helping and not hurtin   States singulair definitely helps  Rarely uses albut inhaler  Not exercising and staying with no major smoke exposure impression asthma   Review of Systems No headache, no major weight loss or weight gain, no chest pain no back pain abdominal pain no change in bowel habits complete ROS otherwise negative     Objective:   Physical Exam  Alert vitals stable, NAD. Blood pressure good on repeat. HEENT normal. Lungs clear. Heart regular rate and rhythm.       Assessment & Plan:  Compared clinically stable patient wishes to remain remain on Singulair discussed asthma diagnosis along with ways to reduce symptoms

## 2018-01-31 ENCOUNTER — Encounter: Payer: Self-pay | Admitting: Family Medicine

## 2018-01-31 ENCOUNTER — Ambulatory Visit: Payer: 59 | Admitting: Family Medicine

## 2018-01-31 VITALS — BP 122/78 | Wt 159.0 lb

## 2018-01-31 DIAGNOSIS — H6121 Impacted cerumen, right ear: Secondary | ICD-10-CM

## 2018-01-31 DIAGNOSIS — J4520 Mild intermittent asthma, uncomplicated: Secondary | ICD-10-CM | POA: Diagnosis not present

## 2018-01-31 MED ORDER — MONTELUKAST SODIUM 10 MG PO TABS: 10.0000 mg | ORAL_TABLET | Freq: Every day | ORAL | 1 refills | Status: DC

## 2018-01-31 NOTE — Progress Notes (Signed)
   Subjective:    Patient ID: Olivia Huffman, female    DOB: 1955-06-21, 63 y.o.   MRN: [id number]  HPI Pt here today for refill on Singulair. Pt doing well. Pt states she would like to have her med checks in summer time due to risk of coming to office and getting sick. Pt states that provider has spoke about tapering off the med such as taking every other day.   Pt would like provider to look in ears to make sure they are clear. Reports right ear has been feeling blocked and not hearing as well.   Feels like breathing has been doing well. Rare use of albuterol inhaler.   Review of Systems  Constitutional: Negative for fever.  HENT: Positive for ear pain and hearing loss. Negative for congestion, rhinorrhea, sinus pressure and sore throat.   Respiratory: Negative for cough, shortness of breath and wheezing.        Objective:   Physical Exam Vitals signs and nursing note reviewed.  Constitutional:      General: She is not in acute distress.    Appearance: Normal appearance. She is not toxic-appearing.  HENT:     Head: Normocephalic and atraumatic.     Right Ear: There is impacted cerumen.     Left Ear: Tympanic membrane normal.     Nose: Nose normal.     Mouth/Throat:     Mouth: Mucous membranes are moist.     Pharynx: Oropharynx is clear.  Eyes:     General:        Right eye: No discharge.        Left eye: No discharge.  Neck:     Musculoskeletal: Neck supple. No neck rigidity.  Cardiovascular:     Rate and Rhythm: Normal rate and regular rhythm.     Heart sounds: Normal heart sounds.  Pulmonary:     Effort: Pulmonary effort is normal. No respiratory distress.     Breath sounds: Normal breath sounds. No wheezing.  Lymphadenopathy:     Cervical: No cervical adenopathy.  Skin:    General: Skin is warm and dry.  Neurological:     Mental Status: She is alert and oriented to person, place, and time.  Psychiatric:        Mood and Affect: Mood normal.             Assessment & Plan:  1. Mild intermittent asthma without complication Pt is doing well on daily use of Singulair and would like to continue. States she never needs her albuterol inhaler anymore, still has one at home just in case. Refills given on Singulair. Pt requesting her rechecks be done in the Summer months. She is due for a wellness exam. Recommend she schedule that in the summer months when she prefers to come in to avoid flu season and we can do her recheck for her singulair at that time.   2. Impacted cerumen of right ear - Plan: Ambulatory referral to ENT Recommend referral to ENT for disimpaction of cerumen.

## 2018-02-05 ENCOUNTER — Encounter: Payer: Self-pay | Admitting: Family Medicine

## 2018-02-13 ENCOUNTER — Other Ambulatory Visit: Payer: Self-pay | Admitting: Family Medicine

## 2018-08-19 ENCOUNTER — Other Ambulatory Visit: Payer: Self-pay | Admitting: Family Medicine

## 2019-01-09 ENCOUNTER — Ambulatory Visit: Payer: 59 | Admitting: Family Medicine

## 2019-01-19 ENCOUNTER — Other Ambulatory Visit: Payer: Self-pay

## 2019-01-19 ENCOUNTER — Ambulatory Visit (INDEPENDENT_AMBULATORY_CARE_PROVIDER_SITE_OTHER): Payer: 59 | Admitting: Family Medicine

## 2019-01-19 DIAGNOSIS — F411 Generalized anxiety disorder: Secondary | ICD-10-CM | POA: Diagnosis not present

## 2019-01-19 DIAGNOSIS — J4520 Mild intermittent asthma, uncomplicated: Secondary | ICD-10-CM | POA: Diagnosis not present

## 2019-01-19 MED ORDER — MONTELUKAST SODIUM 10 MG PO TABS: 10.0000 mg | ORAL_TABLET | Freq: Every day | ORAL | 1 refills | Status: DC

## 2019-01-19 MED ORDER — ESCITALOPRAM OXALATE 10 MG PO TABS: ORAL_TABLET | ORAL | 5 refills | Status: DC

## 2019-01-19 NOTE — Progress Notes (Signed)
   Subjective:  Audio plus video  Patient ID: Olivia Huffman, female    DOB: 1955/01/13, 64 y.o.   MRN: [id number]  HPI Pt is needing refill on chronic medications. Pt states she is needing a one year refill on Singulair.  Patient has ongoing challenge of asthma.  Medications definitely help.  Would like a refill on her medication.  Pt states she is having some anxiety also. Pt state that with the pandemic; she is stressing out easy.  Anxiety is substantial.  Affecting sleep.  Affecting ability to do things she would like to do.  Quite a bit of stress with numerous factors regarding the pandemic.  Understandably concerned about family members in this regard.  Patient notes some sleep disturbance also.  Also wakes up with inability to go back to sleep  Virtual Visit via Video Note  I connected with [name] on 01/19/19 at  8:30 AM EST by a video enabled telemedicine application and verified that I am speaking with the correct person using two identifiers.  Location: Patient: home Provider: office   I discussed the limitations of evaluation and management by telemedicine and the availability of in person appointments. The patient expressed understanding and agreed to proceed.  History of Present Illness:    Observations/Objective:   Assessment and Plan:   Follow Up Instructions:    I discussed the assessment and treatment plan with the patient. The patient was provided an opportunity to ask questions and all were answered. The patient agreed with the plan and demonstrated an understanding of the instructions.   The patient was advised to call back or seek an in-person evaluation if the symptoms worsen or if the condition fails to improve as anticipated.  I provided 20 minutes of non-face-to-face time during this encounter.       Review of Systems No headache no chest pain no shortness of breath    Objective:   Physical Exam   Virtual     Assessment & Plan:    Impression 1 generalized anxiety disorder discussed.  Will initiate medicine.  Lexapro 10 mg p.m.  Side effects benefits discussed  2.  Asthma.  Stable on medication patient to maintain same meds  Exercise also encouraged

## 2019-02-05 ENCOUNTER — Encounter: Payer: Self-pay | Admitting: Family Medicine

## 2019-07-27 ENCOUNTER — Other Ambulatory Visit: Payer: Self-pay | Admitting: *Deleted

## 2019-07-27 MED ORDER — ESCITALOPRAM OXALATE 10 MG PO TABS: ORAL_TABLET | ORAL | 0 refills | Status: DC

## 2019-07-31 ENCOUNTER — Other Ambulatory Visit: Payer: Self-pay

## 2019-07-31 ENCOUNTER — Ambulatory Visit: Payer: 59 | Admitting: Nurse Practitioner

## 2019-07-31 ENCOUNTER — Telehealth: Payer: Self-pay | Admitting: Nurse Practitioner

## 2019-07-31 VITALS — BP 122/78 | Temp 97.3°F | Wt 187.2 lb

## 2019-07-31 DIAGNOSIS — F411 Generalized anxiety disorder: Secondary | ICD-10-CM | POA: Diagnosis not present

## 2019-07-31 DIAGNOSIS — Z13220 Encounter for screening for lipoid disorders: Secondary | ICD-10-CM

## 2019-07-31 DIAGNOSIS — R5383 Other fatigue: Secondary | ICD-10-CM

## 2019-07-31 DIAGNOSIS — J4520 Mild intermittent asthma, uncomplicated: Secondary | ICD-10-CM

## 2019-07-31 DIAGNOSIS — Z79899 Other long term (current) drug therapy: Secondary | ICD-10-CM

## 2019-07-31 DIAGNOSIS — J309 Allergic rhinitis, unspecified: Secondary | ICD-10-CM

## 2019-07-31 MED ORDER — ESCITALOPRAM OXALATE 10 MG PO TABS: ORAL_TABLET | ORAL | 1 refills | Status: DC

## 2019-07-31 MED ORDER — MONTELUKAST SODIUM 10 MG PO TABS: 10.0000 mg | ORAL_TABLET | Freq: Every day | ORAL | 1 refills | Status: DC

## 2019-07-31 NOTE — Telephone Encounter (Signed)
No labs previously ordered. Please advise. Thank you

## 2019-07-31 NOTE — Progress Notes (Signed)
Subjective:    Patient ID: Olivia Huffman, female    DOB: [birth year], 64 y.o.   MRN: [id number]  HPI  Patient arrives for a follow up on anxiety. Patient reports no problems or concerns and needs refill of Lexapro and Singulair. Patient reports since starting Lexapro she is sleeping through the night.  No untoward side effects from Lexapro.  She reports she is still restless at work and has to get up and do something but this has improved since starting Lexapro.  Doesn't have to Take Tylenol pm at night since starting med.  Allergies and asthma well controlled with Singulair. Has not used an albuterol inhaler in a long time.   As of this day, patient is retiring full time and will start working 2 days per week. She will have more time to take care of husband who has alzheimer's.    Sees dermatologist every 6 months for skin issues.    Diet:  Usually healthy.  Exercise:  Walks daily.      Review of Systems  Constitutional: Negative for activity change, appetite change, fatigue and fever.  Respiratory: Negative for cough, shortness of breath and wheezing.   Cardiovascular: Negative for chest pain and leg swelling.  Gastrointestinal: Negative for abdominal pain, constipation, diarrhea and vomiting.  Psychiatric/Behavioral: Negative for sleep disturbance and suicidal ideas. The patient is hyperactive.    Depression screen [facility] 2/9 07/31/2019 01/19/2019 02/01/2017  Decreased Interest 0 0 0  Down, Depressed, Hopeless 0 1 0  PHQ - 2 Score 0 1 0  Altered sleeping 0 - -  Tired, decreased energy 1 - -  Change in appetite 1 - -  Feeling bad or failure about yourself  0 - -  Trouble concentrating 2 - -  Moving slowly or fidgety/restless 2 - -  Suicidal thoughts 0 - -  PHQ-9 Score 6 - -  Difficult doing work/chores Somewhat difficult - -        Objective:   Physical Exam Constitutional:      General: She is not in acute distress.    Appearance: Normal appearance.  Cardiovascular:      Rate and Rhythm: Normal rate and regular rhythm.     Pulses: Normal pulses.     Heart sounds: Normal heart sounds.  Pulmonary:     Effort: Pulmonary effort is normal. No respiratory distress.     Breath sounds: Normal breath sounds.  Lymphadenopathy:     Cervical: No cervical adenopathy.  Skin:    General: Skin is warm and dry.  Neurological:     Mental Status: She is alert.  Psychiatric:        Mood and Affect: Mood normal.        Behavior: Behavior normal.        Thought Content: Thought content normal.        Judgment: Judgment normal.    Vitals:   07/31/19 0924  BP: 122/78  Temp: (!) 97.3 F (36.3 C)           Assessment & Plan:   Problem List Items Addressed This Visit      Respiratory   Allergic rhinitis    Other Visit Diagnoses    Generalized anxiety disorder    -  Primary   Relevant Medications   escitalopram (LEXAPRO) 10 MG tablet   High risk medication use       Relevant Orders   Comprehensive metabolic panel   CBC with Differential/Platelet   Screening, lipid  Relevant Orders   Lipid panel   Other fatigue       Relevant Orders   Comprehensive metabolic panel   TSH   CBC with Differential/Platelet   Mild intermittent asthma without complication       Relevant Medications   montelukast (SINGULAIR) 10 MG tablet      Meds ordered this encounter  Medications  . escitalopram (LEXAPRO) 10 MG tablet    Sig: Take one tablet po each morning.    Dispense:  30 tablet    Refill:  1    Order Specific Question:   Supervising Provider    Answer:   Sallee Lange A [[id number]]  . montelukast (SINGULAIR) 10 MG tablet    Sig: Take 1 tablet (10 mg total) by mouth at bedtime.    Dispense:  30 tablet    Refill:  1    Order Specific Question:   Supervising Provider    Answer:   Kathyrn Drown [phone]   Discussed with patient about health maintenance issues that need addressed:  Last colonoscopy 2010; mammogram 2018; pap smear- last time unknown; bone  density 2013.  Advised to schedule welcome to Medicare physical after 8/18, lab orders entered today, prior to annual exam go by lab and results will be discussed at annual exam visit.  Fast 8-10 hours prior to getting lab work done.  Continue Lexapro as directed. Return in about 6 months (around 01/31/2020) for Recheck on anxiety; recommend Medicare wellness exam in August.

## 2019-07-31 NOTE — Telephone Encounter (Signed)
Patient has welcome to medicare physical in September and needing labs done

## 2019-07-31 NOTE — Telephone Encounter (Signed)
Chat note was sent to Autumn for labs. Thanks!

## 2019-08-03 ENCOUNTER — Encounter: Payer: Self-pay | Admitting: Nurse Practitioner

## 2019-08-03 NOTE — Progress Notes (Signed)
   Subjective:    Patient ID: Olivia Huffman, female    DOB: 27-Apr-1955, 64 y.o.   MRN: [id number]  HPI  See other note.   Review of Systems     Objective:   Physical Exam        Assessment & Plan:

## 2019-08-18 DIAGNOSIS — H26493 Other secondary cataract, bilateral: Secondary | ICD-10-CM | POA: Diagnosis not present

## 2019-08-18 DIAGNOSIS — H18413 Arcus senilis, bilateral: Secondary | ICD-10-CM | POA: Diagnosis not present

## 2019-08-18 DIAGNOSIS — H26491 Other secondary cataract, right eye: Secondary | ICD-10-CM | POA: Diagnosis not present

## 2019-08-18 DIAGNOSIS — H35371 Puckering of macula, right eye: Secondary | ICD-10-CM | POA: Diagnosis not present

## 2019-08-18 DIAGNOSIS — Z961 Presence of intraocular lens: Secondary | ICD-10-CM | POA: Diagnosis not present

## 2019-08-25 DIAGNOSIS — H26492 Other secondary cataract, left eye: Secondary | ICD-10-CM | POA: Diagnosis not present

## 2019-09-03 DIAGNOSIS — Z79899 Other long term (current) drug therapy: Secondary | ICD-10-CM | POA: Diagnosis not present

## 2019-09-03 DIAGNOSIS — R5383 Other fatigue: Secondary | ICD-10-CM | POA: Diagnosis not present

## 2019-09-03 DIAGNOSIS — Z13220 Encounter for screening for lipoid disorders: Secondary | ICD-10-CM | POA: Diagnosis not present

## 2019-09-04 LAB — CBC WITH DIFFERENTIAL/PLATELET
Basophils Absolute: 0.1 10*3/uL (ref 0.0–0.2)
Basos: 1 %
EOS (ABSOLUTE): 0.2 10*3/uL (ref 0.0–0.4)
Eos: 4 %
Hematocrit: 41.2 % (ref 34.0–46.6)
Hemoglobin: 14.4 g/dL (ref 11.1–15.9)
Immature Grans (Abs): 0 10*3/uL (ref 0.0–0.1)
Immature Granulocytes: 0 %
Lymphocytes Absolute: 1.9 10*3/uL (ref 0.7–3.1)
Lymphs: 30 %
MCH: 32.7 pg (ref 26.6–33.0)
MCHC: 35 g/dL (ref 31.5–35.7)
MCV: 94 fL (ref 79–97)
Monocytes Absolute: 0.4 10*3/uL (ref 0.1–0.9)
Monocytes: 7 %
Neutrophils Absolute: 3.8 10*3/uL (ref 1.4–7.0)
Neutrophils: 58 %
Platelets: 253 10*3/uL (ref 150–450)
RBC: 4.4 x10E6/uL (ref 3.77–5.28)
RDW: 12.9 % (ref 11.7–15.4)
WBC: 6.5 10*3/uL (ref 3.4–10.8)

## 2019-09-04 LAB — COMPREHENSIVE METABOLIC PANEL
ALT: 19 IU/L (ref 0–32)
AST: 16 IU/L (ref 0–40)
Albumin/Globulin Ratio: 2 (ref 1.2–2.2)
Albumin: 4.4 g/dL (ref 3.8–4.8)
Alkaline Phosphatase: 88 IU/L (ref 48–121)
BUN/Creatinine Ratio: 24 (ref 12–28)
BUN: 19 mg/dL (ref 8–27)
Bilirubin Total: 0.4 mg/dL (ref 0.0–1.2)
CO2: 23 mmol/L (ref 20–29)
Calcium: 9 mg/dL (ref 8.7–10.3)
Chloride: 105 mmol/L (ref 96–106)
Creatinine, Ser: 0.78 mg/dL (ref 0.57–1.00)
GFR calc Af Amer: 92 mL/min/{1.73_m2} (ref >59)
GFR calc non Af Amer: 80 mL/min/{1.73_m2} (ref >59)
Globulin, Total: 2.2 g/dL (ref 1.5–4.5)
Glucose: 106 mg/dL — ABNORMAL HIGH (ref 65–99)
Potassium: 4.5 mmol/L (ref 3.5–5.2)
Sodium: 140 mmol/L (ref 134–144)
Total Protein: 6.6 g/dL (ref 6.0–8.5)

## 2019-09-04 LAB — LIPID PANEL
Chol/HDL Ratio: 1.7 ratio (ref 0.0–4.4)
Cholesterol, Total: 125 mg/dL (ref 100–199)
HDL: 75 mg/dL (ref >39)
LDL Chol Calc (NIH): 41 mg/dL (ref 0–99)
Triglycerides: 27 mg/dL (ref 0–149)
VLDL Cholesterol Cal: 9 mg/dL (ref 5–40)

## 2019-09-04 LAB — TSH: TSH: 2.17 u[IU]/mL (ref 0.450–4.500)

## 2019-09-11 ENCOUNTER — Encounter: Payer: Self-pay | Admitting: Nurse Practitioner

## 2019-09-11 ENCOUNTER — Other Ambulatory Visit: Payer: Self-pay

## 2019-09-11 ENCOUNTER — Ambulatory Visit (INDEPENDENT_AMBULATORY_CARE_PROVIDER_SITE_OTHER): Payer: Medicare Other | Admitting: Nurse Practitioner

## 2019-09-11 VITALS — BP 126/82 | HR 86 | Temp 97.5°F | Ht 64.5 in | Wt 191.0 lb

## 2019-09-11 DIAGNOSIS — Z01419 Encounter for gynecological examination (general) (routine) without abnormal findings: Secondary | ICD-10-CM | POA: Diagnosis not present

## 2019-09-11 DIAGNOSIS — F411 Generalized anxiety disorder: Secondary | ICD-10-CM

## 2019-09-11 DIAGNOSIS — Z23 Encounter for immunization: Secondary | ICD-10-CM

## 2019-09-11 DIAGNOSIS — Z124 Encounter for screening for malignant neoplasm of cervix: Secondary | ICD-10-CM | POA: Diagnosis not present

## 2019-09-11 DIAGNOSIS — Z1151 Encounter for screening for human papillomavirus (HPV): Secondary | ICD-10-CM

## 2019-09-11 NOTE — Progress Notes (Signed)
Subjective:    Patient ID: Olivia Huffman, female    DOB: June 12, 1955, 64 y.o.   MRN: [id number]  HPI AWV- Annual Wellness Visit  The patient was seen for their annual wellness visit. The patient's past medical history, surgical history, and family history were reviewed. Pertinent vaccines were reviewed ( tetanus, pneumonia, shingles, flu) The patient's medication list was reviewed and updated.  The height and weight were entered.  BMI recorded in electronic record elsewhere  Cognitive screening was completed. Outcome of Mini - Cog: pass   Falls screening electronically recorded within record elsewhere  Depression screen [facility] 2/9 09/11/2019 09/11/2019 07/31/2019 01/19/2019 02/01/2017  Decreased Interest 0 0 0 0 0  Down, Depressed, Hopeless 0 0 0 1 0  PHQ - 2 Score 0 0 0 1 0  Altered sleeping 0 0 0 - -  Tired, decreased energy '1 1 1 ' - -  Change in appetite '2 2 1 ' - -  Feeling bad or failure about yourself  0 0 0 - -  Trouble concentrating '1 1 2 ' - -  Moving slowly or fidgety/restless '1 1 2 ' - -  Suicidal thoughts 0 0 0 - -  PHQ-9 Score '5 5 6 ' - -  Difficult doing work/chores Somewhat difficult Somewhat difficult Somewhat difficult - -    Current tobacco usage: none (All patients who use tobacco were given written and verbal information on quitting)  Recent listing of emergency department/hospitalizations over the past year were reviewed.  current specialist the patient sees on a regular basis: Dermatologist, eye doctor, and dentist.   Medicare annual wellness visit patient questionnaire was reviewed.  A written screening schedule for the patient for the next 5-10 years was given. She defers colonoscopy for now, and will schedule her own Mammogram. Appropriate discussion of followup regarding next visit was discussed.   Patient reports increasing physical exercise and diet for efforts to lose weight, previously had success with keto diet. She walks daily. Reports that she  occasionally missed her Lexapro dose in the morning. Take all her other medications at night.     Review of Systems  HENT: Negative for ear pain, hearing loss, sore throat and trouble swallowing.   Respiratory: Negative for cough, chest tightness, shortness of breath and wheezing.   Cardiovascular: Negative for chest pain.  Gastrointestinal: Negative for abdominal distention, abdominal pain, blood in stool, constipation, diarrhea, nausea and vomiting.  Genitourinary: Negative for difficulty urinating, dysuria, enuresis, frequency, genital sores, hematuria, pelvic pain, urgency, vaginal bleeding and vaginal discharge.  Musculoskeletal: Positive for arthralgias. Negative for gait problem and joint swelling.       Occasional arthritis in hands  Neurological: Negative for dizziness, syncope, weakness, numbness and headaches.  Psychiatric/Behavioral: Negative for sleep disturbance and suicidal ideas. The patient is not nervous/anxious.        Objective:   Physical Exam Exam conducted with a chaperone present.  Constitutional:      Appearance: Normal appearance. She is well-groomed.  HENT:     Right Ear: Tympanic membrane, ear canal and external ear normal.     Left Ear: Tympanic membrane, ear canal and external ear normal.  Neck:     Thyroid: No thyroid mass, thyromegaly or thyroid tenderness.     Trachea: Trachea normal.  Cardiovascular:     Rate and Rhythm: Normal rate and regular rhythm.     Pulses: Normal pulses.          Radial pulses are 2+ on the right side and  2+ on the left side.  Pulmonary:     Effort: Pulmonary effort is normal.     Breath sounds: Normal breath sounds. No wheezing.  Chest:     Breasts:        Right: No inverted nipple, mass, nipple discharge, skin change or tenderness.        Left: No inverted nipple, mass, nipple discharge, skin change or tenderness.  Abdominal:     General: There is no distension.     Palpations: Abdomen is soft.     Tenderness:  There is no abdominal tenderness. There is no guarding.  Genitourinary:    General: Normal vulva.     Comments: Exterior GU pink without rash or lesions. Vagina without abnormal discharge, cervix normal with no cervical motion tenderness, pap smear limited due to sclerotic changes at cervical os. Bimanual exam: limited due to abdominal girth, no obvious tenderness or masses.  Musculoskeletal:     Right lower leg: No edema.     Left lower leg: No edema.  Lymphadenopathy:     Head:     Right side of head: No submental, submandibular, tonsillar, preauricular, posterior auricular or occipital adenopathy.     Left side of head: No submental, submandibular, tonsillar, preauricular, posterior auricular or occipital adenopathy.     Cervical: No cervical adenopathy.     Right cervical: No superficial, deep or posterior cervical adenopathy.    Left cervical: No superficial, deep or posterior cervical adenopathy.     Upper Body:     Right upper body: No supraclavicular, axillary or pectoral adenopathy.     Left upper body: No supraclavicular, axillary or pectoral adenopathy.  Skin:    General: Skin is warm and dry.     Capillary Refill: Capillary refill takes 2 to 3 seconds.  Neurological:     Mental Status: She is alert.  Psychiatric:        Mood and Affect: Mood normal.        Behavior: Behavior normal.        Thought Content: Thought content normal.        Judgment: Judgment normal.    Today's Vitals   09/11/19 1043  BP: 126/82  Pulse: 86  Temp: (!) 97.5 F (36.4 C)  SpO2: 96%  Weight: 86.6 kg  Height: 5' 4.5" (1.638 m)   Body mass index is 32.28 kg/m. Recent Results (from the past 2160 hour(s))  Comprehensive metabolic panel     Status: Abnormal   Collection Time: 09/03/19  9:09 AM  Result Value Ref Range   Glucose 106 (H) 65 - 99 mg/dL   BUN 19 8 - 27 mg/dL   Creatinine, Ser 0.78 0.57 - 1.00 mg/dL   GFR calc non Af Amer 80 >59 mL/min/1.73   GFR calc Af Amer 92 >59  mL/min/1.73    Comment: **[facility] currently reports eGFR in compliance with the current**   recommendations of the [employer]. [facility] will   update reporting as new guidelines are published from the NKF-ASN   Task force.    BUN/Creatinine Ratio 24 12 - 28   Sodium 140 134 - 144 mmol/L   Potassium 4.5 3.5 - 5.2 mmol/L   Chloride 105 96 - 106 mmol/L   CO2 23 20 - 29 mmol/L   Calcium 9.0 8.7 - 10.3 mg/dL   Total Protein 6.6 6.0 - 8.5 g/dL   Albumin 4.4 3.8 - 4.8 g/dL   Globulin, Total 2.2 1.5 - 4.5 g/dL  Albumin/Globulin Ratio 2.0 1.2 - 2.2   Bilirubin Total 0.4 0.0 - 1.2 mg/dL   Alkaline Phosphatase 88 48 - 121 IU/L    Comment: **Effective September 14, 2019 Alkaline Phosphatase**   reference interval will be changing to:              Age                Female          Female           0 -  5 days         60 - 127       103 - 127           6 - 10 days         83 - 242       71 - 242          81 - 20 days        109 - 357      109 - 357          21 - 30 days         94 - 427       06 - 494           1 -  2 months      149 - 539      149 - 539           3 -  6 months      131 - 452      131 - 452           7 - 11 months      117 - 401      117 - 401   12 months -  6 years       158 - 369      158 - 369           7 - 12 years       150 - 409      150 - 409               13 years       70 - 435       36 - 227               14 years       114 - 375       25 - 161               15 years        69 - 279       38 - 134               16 years        71 - 72       51 - 121               53 years        80 - 161       10 - 113          48 - 44 years        18 - 125       83 - 106              >20 years         31 - 121  44 - 121    AST 16 0 - 40 IU/L   ALT 19 0 - 32 IU/L  Lipid panel     Status: None   Collection Time: 09/03/19  9:09 AM  Result Value Ref Range   Cholesterol, Total 125 100 - 199 mg/dL   Triglycerides 27 0 - 149 mg/dL   HDL 75 >39 mg/dL   VLDL  Cholesterol Cal 9 5 - 40 mg/dL   LDL Chol Calc (NIH) 41 0 - 99 mg/dL   Chol/HDL Ratio 1.7 0.0 - 4.4 ratio    Comment:                                   T. Chol/HDL Ratio                                             Men  Women                               1/2 Avg.Risk  3.4    3.3                                   Avg.Risk  5.0    4.4                                2X Avg.Risk  9.6    7.1                                3X Avg.Risk 23.4   11.0   TSH     Status: None   Collection Time: 09/03/19  9:09 AM  Result Value Ref Range   TSH 2.170 0.450 - 4.500 uIU/mL  CBC with Differential/Platelet     Status: None   Collection Time: 09/03/19  9:09 AM  Result Value Ref Range   WBC 6.5 3.4 - 10.8 x10E3/uL   RBC 4.40 3.77 - 5.28 x10E6/uL   Hemoglobin 14.4 11.1 - 15.9 g/dL   Hematocrit 41.2 34.0 - 46.6 %   MCV 94 79 - 97 fL   MCH 32.7 26.6 - 33.0 pg   MCHC 35.0 31 - 35 g/dL   RDW 12.9 11.7 - 15.4 %   Platelets 253 150 - 450 x10E3/uL   Neutrophils 58 Not Estab. %   Lymphs 30 Not Estab. %   Monocytes 7 Not Estab. %   Eos 4 Not Estab. %   Basos 1 Not Estab. %   Neutrophils Absolute 3.8 1 - 7 x10E3/uL   Lymphocytes Absolute 1.9 0 - 3 x10E3/uL   Monocytes Absolute 0.4 0 - 0 x10E3/uL   EOS (ABSOLUTE) 0.2 0.0 - 0.4 x10E3/uL   Basophils Absolute 0.1 0 - 0 x10E3/uL   Immature Granulocytes 0 Not Estab. %   Immature Grans (Abs) 0.0 0.0 - 0.1 x10E3/uL        Assessment & Plan:   Problem List Items Addressed This Visit      Other   Generalized anxiety disorder    Other Visit Diagnoses  Well woman exam    -  Primary   Relevant Orders   Flu Vaccine QUAD 6+ mos PF IM (Fluarix Quad PF) (Completed)   IGP, Aptima HPV   Need for vaccination       Relevant Orders   Flu Vaccine QUAD 6+ mos PF IM (Fluarix Quad PF) (Completed)   Screening for cervical cancer       Relevant Orders   IGP, Aptima HPV   Screening for HPV (human papillomavirus)       Relevant Orders   IGP, Aptima HPV        Labs reviewed with patients Patient educated about importance of a balanced diet and exercise.  Informed patient to take Lexapro in the evening with her other medications to prevent any missed doses.  Flu vaccine received today. Patient plans to schedule mammogram. Defers pneumonia vaccine today. Will discuss DEXA scan and Pneumonia vaccine next visit.  Follow up in 6 months

## 2019-09-13 DIAGNOSIS — F411 Generalized anxiety disorder: Secondary | ICD-10-CM | POA: Insufficient documentation

## 2019-09-13 NOTE — Progress Notes (Signed)
   Subjective:    Patient ID: Olivia Huffman, female    DOB: Jan 24, 1955, 64 y.o.   MRN: [id number]  HPI    Review of Systems     Objective:   Physical Exam        Assessment & Plan:

## 2019-09-15 LAB — IGP, APTIMA HPV: HPV Aptima: NEGATIVE

## 2019-09-17 ENCOUNTER — Other Ambulatory Visit (HOSPITAL_COMMUNITY): Payer: Self-pay | Admitting: Family Medicine

## 2019-09-17 DIAGNOSIS — Z1231 Encounter for screening mammogram for malignant neoplasm of breast: Secondary | ICD-10-CM

## 2019-10-02 ENCOUNTER — Other Ambulatory Visit: Payer: Self-pay | Admitting: Nurse Practitioner

## 2019-10-02 ENCOUNTER — Telehealth: Payer: Self-pay | Admitting: Nurse Practitioner

## 2019-10-02 MED ORDER — ESCITALOPRAM OXALATE 10 MG PO TABS: ORAL_TABLET | ORAL | 1 refills | Status: DC

## 2019-10-02 NOTE — Telephone Encounter (Signed)
Pt called into office and is needing refills on Singulair and Lexapro. Pt was in 09/11/19 for wellness. Please advise. Thank you Sherwood

## 2019-10-02 NOTE — Telephone Encounter (Signed)
Done

## 2019-10-05 DIAGNOSIS — L820 Inflamed seborrheic keratosis: Secondary | ICD-10-CM | POA: Diagnosis not present

## 2019-10-05 DIAGNOSIS — L218 Other seborrheic dermatitis: Secondary | ICD-10-CM | POA: Diagnosis not present

## 2019-10-05 DIAGNOSIS — L570 Actinic keratosis: Secondary | ICD-10-CM | POA: Diagnosis not present

## 2019-10-05 DIAGNOSIS — X32XXXD Exposure to sunlight, subsequent encounter: Secondary | ICD-10-CM | POA: Diagnosis not present

## 2019-10-23 ENCOUNTER — Other Ambulatory Visit: Payer: Self-pay

## 2019-10-23 ENCOUNTER — Ambulatory Visit (HOSPITAL_COMMUNITY)
Admission: RE | Admit: 2019-10-23 | Discharge: 2019-10-23 | Disposition: A | Discharge status: Home / Self Care | Payer: Medicare Other | Source: Ambulatory Visit | Attending: Family Medicine | Admitting: Family Medicine

## 2019-10-23 DIAGNOSIS — Z1231 Encounter for screening mammogram for malignant neoplasm of breast: Secondary | ICD-10-CM | POA: Diagnosis not present

## 2019-10-27 ENCOUNTER — Other Ambulatory Visit (HOSPITAL_COMMUNITY): Payer: Self-pay | Admitting: Family Medicine

## 2019-10-27 DIAGNOSIS — R928 Other abnormal and inconclusive findings on diagnostic imaging of breast: Secondary | ICD-10-CM

## 2019-10-29 ENCOUNTER — Other Ambulatory Visit: Payer: Self-pay | Admitting: Nurse Practitioner

## 2019-11-03 ENCOUNTER — Other Ambulatory Visit: Payer: Self-pay

## 2019-11-03 ENCOUNTER — Ambulatory Visit (HOSPITAL_COMMUNITY)
Admission: RE | Admit: 2019-11-03 | Discharge: 2019-11-03 | Disposition: A | Discharge status: Home / Self Care | Payer: Medicare Other | Source: Ambulatory Visit | Attending: Family Medicine | Admitting: Family Medicine

## 2019-11-03 DIAGNOSIS — R928 Other abnormal and inconclusive findings on diagnostic imaging of breast: Secondary | ICD-10-CM | POA: Diagnosis not present

## 2019-11-03 DIAGNOSIS — R922 Inconclusive mammogram: Secondary | ICD-10-CM | POA: Diagnosis not present

## 2019-11-05 DIAGNOSIS — Z23 Encounter for immunization: Secondary | ICD-10-CM | POA: Diagnosis not present

## 2019-12-01 ENCOUNTER — Other Ambulatory Visit: Payer: Self-pay | Admitting: Nurse Practitioner

## 2019-12-01 ENCOUNTER — Other Ambulatory Visit: Payer: Self-pay | Admitting: Family Medicine

## 2019-12-31 ENCOUNTER — Other Ambulatory Visit: Payer: Self-pay | Admitting: Nurse Practitioner

## 2020-02-28 ENCOUNTER — Other Ambulatory Visit: Payer: Self-pay | Admitting: Family Medicine

## 2020-03-11 ENCOUNTER — Other Ambulatory Visit: Payer: Self-pay

## 2020-03-11 ENCOUNTER — Ambulatory Visit (INDEPENDENT_AMBULATORY_CARE_PROVIDER_SITE_OTHER): Payer: Medicare Other | Admitting: Nurse Practitioner

## 2020-03-11 ENCOUNTER — Other Ambulatory Visit: Payer: Self-pay | Admitting: Nurse Practitioner

## 2020-03-11 ENCOUNTER — Encounter: Payer: Self-pay | Admitting: Nurse Practitioner

## 2020-03-11 VITALS — BP 125/83 | HR 71 | Temp 98.1°F | Wt 197.6 lb

## 2020-03-11 DIAGNOSIS — F411 Generalized anxiety disorder: Secondary | ICD-10-CM

## 2020-03-11 DIAGNOSIS — N644 Mastodynia: Secondary | ICD-10-CM

## 2020-03-11 MED ORDER — ESCITALOPRAM OXALATE 10 MG PO TABS: 10.0000 mg | ORAL_TABLET | Freq: Every morning | ORAL | 1 refills | Status: DC

## 2020-03-11 MED ORDER — CLONAZEPAM 0.5 MG PO TABS: ORAL_TABLET | ORAL | 1 refills | Status: AC

## 2020-03-11 NOTE — Progress Notes (Signed)
Subjective:    Patient ID: Olivia Huffman, female    DOB: Sep 13, 1955, 65 y.o.   MRN: [id number]  HPI Pt here for follow up. Pt taking Lexapro 10 mg daily. No issues or side effects.  Overall has seen some improvement.  Has a rare episode of stress or more significant anxiety. Pt had mammo done in November and it showed cyst in breast. Pt states when grand child hits her breast or breast gets accidentally hit, they will begin to hurt. Pt wants to make sure its normal for cysts to hurt sometime. Complaints of pain in her right breast for the past 3 months.  Mainly in the lower right breast around 6:00.  Relates this to her grandchild occasionally jumping on her lap but this does not occur in the left breast.  No family history of breast cancer.   Review of Systems Depression screen [facility] 2/9 03/11/2020 09/11/2019 09/11/2019 07/31/2019 01/19/2019  Decreased Interest 0 0 0 0 0  Down, Depressed, Hopeless 0 0 0 0 1  PHQ - 2 Score 0 0 0 0 1  Altered sleeping 1 0 0 0 -  Tired, decreased energy 1 1 1 1  -  Change in appetite 1 2 2 1  -  Feeling bad or failure about yourself  0 0 0 0 -  Trouble concentrating 2 1 1 2  -  Moving slowly or fidgety/restless 0 1 1 2  -  Suicidal thoughts 0 0 0 0 -  PHQ-9 Score 5 5 5 6  -  Difficult doing work/chores Not difficult at all Somewhat difficult Somewhat difficult Somewhat difficult -   GAD 7 : Generalized Anxiety Score 03/11/2020 01/19/2019  Nervous, Anxious, on Edge 1 3  Control/stop worrying 1 3  Worry too much - different things 1 3  Trouble relaxing 1 3  Restless 1 3  Easily annoyed or irritable 1 3  Afraid - awful might happen 1 3  Total GAD 7 Score 7 21  Anxiety Difficulty Not difficult at all Extremely difficult        Objective:   Physical Exam NAD.  Alert, oriented.  Lungs clear.  Heart regular rate rhythm.  Breast exam left breast no masses, no nipple inversion, no axillary lymphadenopathy.  Right breast density with minor fine nodularity noted in  the lower part of the breast, no dominant masses.  No nipple discharge.  No inversion of the right nipple.  No axillary lymphadenopathy.  Nontender to palpation. Patient had an ultrasound and diagnostic mammogram of the left breast 11/03/19. Today's Vitals   03/11/20 1449  BP: 125/83  Pulse: 71  Temp: 98.1 F (36.7 C)  SpO2: 95%  Weight: 197 lb 9.6 oz (89.6 kg)   Body mass index is 33.39 kg/m.      Assessment & Plan:   Problem List Items Addressed This Visit      Other   Generalized anxiety disorder   Relevant Medications   escitalopram (LEXAPRO) 10 MG tablet    Other Visit Diagnoses    Pain of right breast    -  Primary     Meds ordered this encounter  Medications  . escitalopram (LEXAPRO) 10 MG tablet    Sig: Take 1 tablet (10 mg total) by mouth every morning.    Dispense:  90 tablet    Refill:  1    Order Specific Question:   Supervising Provider    Answer:   Sallee Lange A [[id number]]  . clonazePAM (KLONOPIN) 0.5 MG tablet  Sig: Take 1/2-1 tab po BID prn extreme anxiety    Dispense:  20 tablet    Refill:  1    Order Specific Question:   Supervising Provider    Answer:   Sallee Lange A [[id number]]   Continue Lexapro as directed.  Given limited prescription for clonazepam to use only for extreme anxiety. Diagnostic mammogram and ultrasound scheduled for right breast.  Further follow-up based on results. Return in about 6 months (around 09/11/2020).

## 2020-04-26 ENCOUNTER — Other Ambulatory Visit: Payer: Self-pay

## 2020-04-26 ENCOUNTER — Ambulatory Visit
Admission: RE | Admit: 2020-04-26 | Discharge: 2020-04-26 | Disposition: A | Discharge status: Home / Self Care | Payer: Medicare Other | Source: Ambulatory Visit | Attending: Nurse Practitioner | Admitting: Nurse Practitioner

## 2020-04-26 ENCOUNTER — Ambulatory Visit: Payer: Medicare Other

## 2020-04-26 DIAGNOSIS — N644 Mastodynia: Secondary | ICD-10-CM | POA: Diagnosis not present

## 2020-04-26 DIAGNOSIS — R922 Inconclusive mammogram: Secondary | ICD-10-CM | POA: Diagnosis not present

## 2020-05-28 ENCOUNTER — Other Ambulatory Visit: Payer: Self-pay | Admitting: Family Medicine

## 2020-05-31 ENCOUNTER — Other Ambulatory Visit: Payer: Self-pay

## 2020-05-31 ENCOUNTER — Emergency Department (HOSPITAL_COMMUNITY)
Admission: EM | Admit: 2020-05-31 | Discharge: 2020-05-31 | Disposition: A | Discharge status: Home / Self Care | Payer: Medicare Other | Attending: Emergency Medicine | Admitting: Emergency Medicine

## 2020-05-31 ENCOUNTER — Emergency Department (HOSPITAL_COMMUNITY): Payer: Medicare Other

## 2020-05-31 ENCOUNTER — Encounter (HOSPITAL_COMMUNITY): Payer: Self-pay | Admitting: *Deleted

## 2020-05-31 DIAGNOSIS — J45909 Unspecified asthma, uncomplicated: Secondary | ICD-10-CM | POA: Diagnosis not present

## 2020-05-31 DIAGNOSIS — Z23 Encounter for immunization: Secondary | ICD-10-CM | POA: Diagnosis not present

## 2020-05-31 DIAGNOSIS — L570 Actinic keratosis: Secondary | ICD-10-CM | POA: Diagnosis not present

## 2020-05-31 DIAGNOSIS — M9261 Juvenile osteochondrosis of tarsus, right ankle: Secondary | ICD-10-CM | POA: Diagnosis not present

## 2020-05-31 DIAGNOSIS — M9262 Juvenile osteochondrosis of tarsus, left ankle: Secondary | ICD-10-CM | POA: Diagnosis not present

## 2020-05-31 DIAGNOSIS — X32XXXD Exposure to sunlight, subsequent encounter: Secondary | ICD-10-CM | POA: Diagnosis not present

## 2020-05-31 DIAGNOSIS — W01198A Fall on same level from slipping, tripping and stumbling with subsequent striking against other object, initial encounter: Secondary | ICD-10-CM | POA: Diagnosis not present

## 2020-05-31 DIAGNOSIS — M79671 Pain in right foot: Secondary | ICD-10-CM | POA: Diagnosis not present

## 2020-05-31 DIAGNOSIS — Y93H2 Activity, gardening and landscaping: Secondary | ICD-10-CM | POA: Diagnosis not present

## 2020-05-31 DIAGNOSIS — I10 Essential (primary) hypertension: Secondary | ICD-10-CM | POA: Diagnosis not present

## 2020-05-31 DIAGNOSIS — Z1283 Encounter for screening for malignant neoplasm of skin: Secondary | ICD-10-CM | POA: Diagnosis not present

## 2020-05-31 DIAGNOSIS — M79672 Pain in left foot: Secondary | ICD-10-CM | POA: Diagnosis not present

## 2020-05-31 DIAGNOSIS — Z85820 Personal history of malignant melanoma of skin: Secondary | ICD-10-CM | POA: Insufficient documentation

## 2020-05-31 DIAGNOSIS — Z043 Encounter for examination and observation following other accident: Secondary | ICD-10-CM | POA: Diagnosis not present

## 2020-05-31 DIAGNOSIS — Z08 Encounter for follow-up examination after completed treatment for malignant neoplasm: Secondary | ICD-10-CM | POA: Diagnosis not present

## 2020-05-31 DIAGNOSIS — Z7952 Long term (current) use of systemic steroids: Secondary | ICD-10-CM | POA: Diagnosis not present

## 2020-05-31 DIAGNOSIS — S0101XA Laceration without foreign body of scalp, initial encounter: Secondary | ICD-10-CM | POA: Diagnosis not present

## 2020-05-31 DIAGNOSIS — M779 Enthesopathy, unspecified: Secondary | ICD-10-CM | POA: Diagnosis not present

## 2020-05-31 DIAGNOSIS — S0990XA Unspecified injury of head, initial encounter: Secondary | ICD-10-CM | POA: Diagnosis not present

## 2020-05-31 DIAGNOSIS — D225 Melanocytic nevi of trunk: Secondary | ICD-10-CM | POA: Diagnosis not present

## 2020-05-31 DIAGNOSIS — W19XXXA Unspecified fall, initial encounter: Secondary | ICD-10-CM

## 2020-05-31 DIAGNOSIS — M7731 Calcaneal spur, right foot: Secondary | ICD-10-CM | POA: Diagnosis not present

## 2020-05-31 MED ORDER — TETANUS-DIPHTH-ACELL PERTUSSIS 5-2.5-18.5 LF-MCG/0.5 IM SUSY
0.5000 mL | PREFILLED_SYRINGE | Freq: Once | INTRAMUSCULAR | Status: AC
  Administered 2020-05-31: 0.5 mL via INTRAMUSCULAR
  Filled 2020-05-31: qty 0.5

## 2020-05-31 NOTE — Discharge Instructions (Addendum)
Exam and imaging are reassuring.  I have given you 2 staples in your head.  You may get the wound wet please refrain from scrubbing it.  You may take over-the-counter pain medications like ibuprofen Tylenol as needed.  You must follow-up at this department, urgent care, your PCP in the next 7 to 10 days to have your sutures removed.  Please come back  if you develop severe headaches, change in vision, numbness or tingling in your arms or legs, uncontrolled nausea or vomiting, lightheadedness dizziness loss of balance or loss speech as these symptoms require further evaluation.

## 2020-05-31 NOTE — ED Triage Notes (Signed)
Pt states her porch gave away and she landed her head on the back of a wrought iron swing; pt has a laceration to top center of her head; bleeding controlled at this time

## 2020-05-31 NOTE — ED Provider Notes (Signed)
[facility] EMERGENCY DEPARTMENT Provider Note   CSN: [id number] Arrival date & time: 05/31/20  2003     History Chief Complaint  Patient presents with  . Fall    Olivia Huffman is a 65 y.o. female.  HPI    Patient with no significant medical history presents to the emergency department with chief complaint of head laceration.  Patient states today while she was out trying to water her plants she stepped on a rotten board causing it to break.  She fell onto her backside and hit her head on a iron pole.  She denies losing conscious, is not on anticoagulant.  Patient states she was able to get herself up off the floor, she denies headaches, change in vision, paresthesias or weakness in the upper or lower extremities.  Patient denies neck, back pain, chest pain, abdominal pain.  Patient is unsure of when her last tetanus shot was, she is not immunocompromise.  Patient has no other complaints at this time.  Patient denies headaches, fevers, chills, shortness of breath, chest pain, abdominal pain, nausea, vomiting, diarrhea.  Past Medical History:  Diagnosis Date  . Arthritis   . Cancer ([place])    melanoma on LLE  . Constipation   . Perennial allergic rhinitis   . Pneumonia    hx of  . Reactive airway disease   . Venous stasis 09/13/2013    Patient Active Problem List   Diagnosis Date Noted  . Generalized anxiety disorder 09/13/2019  . Venous stasis 09/13/2013  . Allergic rhinitis 07/27/2012  . Asthma, chronic 07/27/2012  . HYPERTENSION 02/20/2008  . RECTAL FISSURE 02/20/2008  . ARTHRITIS 02/20/2008  . ABDOMINAL PAIN 02/20/2008    Past Surgical History:  Procedure Laterality Date  . COLONOSCOPY    . LESION REMOVAL Left 07/16/2013   Procedure: EXCISE MELANOMA OF LOWER LEFT LEG;  Surgeon: Crissie Reese, MD;  Location: [place];  Service: Plastics;  Laterality: Left;  Marland Kitchen MASS EXCISION Left 09/06/2016   Procedure: BIOPSY/EXCISION MASS NAILBED LEFT THUMB;  Surgeon: Daryll Brod, MD;   Location: [place];  Service: Orthopedics;  Laterality: Left;  REG/FAB     OB History   No obstetric history on file.     Family History  Problem Relation Age of Onset  . Diabetes Mother   . Hypertension Mother     Social History   Tobacco Use  . Smoking status: Never Smoker  . Smokeless tobacco: Never Used  Vaping Use  . Vaping Use: Never used  Substance Use Topics  . Alcohol use: Yes    Comment: occasional  . Drug use: No    Home Medications Prior to Admission medications   Medication Sig Start Date End Date Taking? Authorizing Provider  acetaminophen (TYLENOL) 500 MG tablet Take 1,000 mg by mouth every 6 (six) hours as needed for mild pain or headache.    [provider]  albuterol (PROVENTIL HFA;VENTOLIN HFA) 108 (90 Base) MCG/ACT inhaler Inhale 2 puffs into the lungs every 4 (four) hours as needed. 03/16/16   Nilda Simmer, NP  clonazePAM (KLONOPIN) 0.5 MG tablet Take 1/2-1 tab po BID prn extreme anxiety 03/11/20   Nilda Simmer, NP  escitalopram (LEXAPRO) 10 MG tablet Take 1 tablet (10 mg total) by mouth every morning. 03/11/20   Nilda Simmer, NP  montelukast (SINGULAIR) 10 MG tablet TAKE 1 TABLET(10 MG) BY MOUTH AT BEDTIME 02/29/20   Lovena Le, Malena M, DO  omeprazole (PRILOSEC) 20 MG capsule  Take 20 mg by mouth daily.    [provider]  polyethylene glycol (MIRALAX / GLYCOLAX) packet Take 17 g by mouth daily.    [provider]    Allergies    Sulfonamide derivatives  Review of Systems   Review of Systems  Constitutional: Negative for chills and fever.  HENT: Negative for congestion.   Respiratory: Negative for shortness of breath.   Cardiovascular: Negative for chest pain.  Gastrointestinal: Negative for abdominal pain.  Genitourinary: Negative for enuresis.  Musculoskeletal: Negative for back pain.  Skin: Positive for wound. Negative for rash.       Laceration on the back of the head  Neurological:  Negative for dizziness.  Hematological: Does not bruise/bleed easily.    Physical Exam Updated Vital Signs BP (!) 142/86 (BP Location: Right Arm)   Pulse 69   Temp 98.6 F (37 C) (Oral)   Resp 18   Ht 5\' 6"  (1.676 m)   Wt 85.7 kg   SpO2 99%   BMI 30.51 kg/m   Physical Exam Vitals and nursing note reviewed.  Constitutional:      General: She is not in acute distress.    Appearance: She is not ill-appearing.  HENT:     Head: Normocephalic and atraumatic.     Comments: Patient's head was evaluated she has a noted laceration measuring 4 cm in length on the superior aspect of the occipital lobe, there is no foreign bodies present.  It was hemodynamically stable.  No other gross deformities present.    Nose: No congestion.  Eyes:     Conjunctiva/sclera: Conjunctivae normal.  Cardiovascular:     Rate and Rhythm: Normal rate and regular rhythm.     Pulses: Normal pulses.     Heart sounds: No murmur heard. No friction rub. No gallop.   Pulmonary:     Effort: No respiratory distress.     Breath sounds: No wheezing, rhonchi or rales.  Musculoskeletal:     Comments: Patient spine was palpated it was nontender to palpation, no step-off deformities present.  Patient has full range of motion, 5 of 5 strength, neurovascular intact in the upper lower extremities.  Chest was nontender to palpation.  Skin:    General: Skin is warm and dry.  Neurological:     Mental Status: She is alert.  Psychiatric:        Mood and Affect: Mood normal.     ED Results / Procedures / Treatments   Labs (all labs ordered are listed, but only abnormal results are displayed) Labs Reviewed - No data to display  EKG None  Radiology CT Head Wo Contrast  Result Date: 05/31/2020 CLINICAL DATA:  Fall EXAM: CT HEAD WITHOUT CONTRAST TECHNIQUE: Contiguous axial images were obtained from the base of the skull through the vertex without intravenous contrast. COMPARISON:  None. FINDINGS: Brain: No acute  intracranial abnormality. Specifically, no hemorrhage, hydrocephalus, mass lesion, acute infarction, or significant intracranial injury. Vascular: No hyperdense vessel or unexpected calcification. Skull: No acute calvarial abnormality. Sinuses/Orbits: No acute findings Other: None IMPRESSION: No acute intracranial abnormality. Electronically Signed   By: Rolm Baptise M.D.   On: 05/31/2020 21:26    Procedures .Marland KitchenLaceration Repair  Date/Time: 05/31/2020 10:39 PM Performed by: Marcello Fennel, PA-C Authorized by: Marcello Fennel, PA-C   Consent:    Consent obtained:  Verbal   Consent given by:  Patient   Risks discussed:  Infection, pain, retained foreign body, need for additional repair, poor  cosmetic result, tendon damage, vascular damage, poor wound healing and nerve damage   Alternatives discussed:  No treatment Universal protocol:    Patient identity confirmed:  Verbally with patient Anesthesia:    Anesthesia method:  None Laceration details:    Location:  Scalp   Scalp location:  Occipital   Length (cm):  4   Depth (mm):  1 Pre-procedure details:    Preparation:  Patient was prepped and draped in usual sterile fashion and imaging obtained to evaluate for foreign bodies Exploration:    Imaging obtained: x-ray     Imaging outcome: foreign body not noted     Wound exploration: wound explored through full range of motion     Contaminated: no   Treatment:    Area cleansed with:  Betadine and saline   Amount of cleaning:  Standard   Irrigation method:  Syringe   Visualized foreign bodies/material removed: no     Debridement:  None Skin repair:    Repair method:  Staples   Number of staples:  2 Approximation:    Approximation:  Close Repair type:    Repair type:  Simple Post-procedure details:    Procedure completion:  Tolerated well, no immediate complications     Medications Ordered in ED Medications  Tdap (BOOSTRIX) injection 0.5 mL (0.5 mLs Intramuscular Given  05/31/20 2130)    ED Course  I have reviewed the triage vital signs and the nursing notes.  Pertinent labs & imaging results that were available during my care of the patient were reviewed by me and considered in my medical decision making (see chart for details).    MDM Rules/Calculators/A&P                         Initial impression-patient presents after having a mechanical fall.  She is alert, does not appear to be in acute distress, vital signs reassuring.  Due to mechanism of injury will obtain CT head for further evaluation, update patient's tetanus shot, recommend staples for improved healing.  Work-up-CT head is negative for acute findings.  Reassessment-patient was in agreement for lack repair.  Patient received staples, patient tolerated the procedure well.  Updated patient on imaging, vital signs remained stable, patient has no complaints this time, patient is agreeable for discharge at this time.  Rule out-low suspicion for intracranial head bleed as there is no focal deficits present my exam, CT head is negative for acute findings.  Low suspicion for spinal cord abnormality or spinal fracture spine as spine palpated was nontender to palpation, no step-off or deformities present.  Low suspicion for rib fracture or pneumothorax as ribs are nontender to palpation, lung sounds are clear bilaterally.  low suspicion for intra-abdominal abnormality as abdomen soft nontender to palpation.  Low suspicion for hip fracture as there is no leg shortening or internal or external rotation, patient ambulating without difficulty.  Plan-  1.  Head laceration-patient received 2 staples, will recommend over-the-counter pain medications, follow-up neck 7 to 10 days for suture removal.  Vital signs have remained stable, no indication for hospital admission.  Patient given at home care as well strict return precautions.  Patient verbalized that they understood agreed to said plan.   Final Clinical  Impression(s) / ED Diagnoses Final diagnoses:  Fall, initial encounter  Injury of head, initial encounter    Rx / DC Orders ED Discharge Orders    None       Marcello Fennel,  PA-C 05/31/20 2239    Hayden Rasmussen, MD 06/01/20 1019

## 2020-05-31 NOTE — ED Notes (Signed)
Patient transported to CT 

## 2020-06-09 ENCOUNTER — Ambulatory Visit (INDEPENDENT_AMBULATORY_CARE_PROVIDER_SITE_OTHER): Payer: Medicare Other | Admitting: Family Medicine

## 2020-06-09 ENCOUNTER — Encounter: Payer: Self-pay | Admitting: Family Medicine

## 2020-06-09 ENCOUNTER — Other Ambulatory Visit: Payer: Self-pay

## 2020-06-09 VITALS — BP 133/78 | HR 60 | Temp 98.4°F | Ht 66.0 in | Wt 200.0 lb

## 2020-06-09 DIAGNOSIS — S0101XD Laceration without foreign body of scalp, subsequent encounter: Secondary | ICD-10-CM | POA: Diagnosis not present

## 2020-06-09 DIAGNOSIS — Z4802 Encounter for removal of sutures: Secondary | ICD-10-CM

## 2020-06-09 NOTE — Progress Notes (Signed)
Patient ID: Olivia Huffman, female    DOB: [birth year], 65 y.o.   MRN: [id number]   Chief Complaint  Patient presents with   Laceration    Staple removal.    Subjective:    HPI Pt tripped and hit her head on an iron glider chair.  Had a laceration to top of scalp.  Went to ED and had staples put in. Healing well.  Put in at ED on 5/31.  Hit a rod iron glider after falling through deck board.   No LOC.   2 staples in scalp for laceration.  Medical History A[place]a has a past medical history of Arthritis, Cancer ([place]), Constipation, Perennial allergic rhinitis, Pneumonia, Reactive airway disease, and Venous stasis (09/13/2013).   Outpatient Encounter Medications as of 06/09/2020  Medication Sig   acetaminophen (TYLENOL) 500 MG tablet Take 1,000 mg by mouth every 6 (six) hours as needed for mild pain or headache.   albuterol (PROVENTIL HFA;VENTOLIN HFA) 108 (90 Base) MCG/ACT inhaler Inhale 2 puffs into the lungs every 4 (four) hours as needed.   clonazePAM (KLONOPIN) 0.5 MG tablet Take 1/2-1 tab po BID prn extreme anxiety   escitalopram (LEXAPRO) 10 MG tablet Take 1 tablet (10 mg total) by mouth every morning.   montelukast (SINGULAIR) 10 MG tablet TAKE 1 TABLET(10 MG) BY MOUTH AT BEDTIME   omeprazole (PRILOSEC) 20 MG capsule Take 20 mg by mouth daily.   polyethylene glycol (MIRALAX / GLYCOLAX) packet Take 17 g by mouth daily.   No facility-administered encounter medications on file as of 06/09/2020.     Review of Systems  Constitutional:  Negative for chills and fever.  HENT:  Negative for congestion, rhinorrhea and sore throat.   Respiratory:  Negative for cough, shortness of breath and wheezing.   Cardiovascular:  Negative for chest pain and leg swelling.  Gastrointestinal:  Negative for abdominal pain, diarrhea, nausea and vomiting.  Genitourinary:  Negative for dysuria and frequency.  Musculoskeletal:  Negative for arthralgias and back pain.  Skin:  Positive for wound  (scalp lac). Negative for rash.  Neurological:  Negative for dizziness, weakness and headaches.    Vitals BP 133/78   Pulse 60   Temp 98.4 F (36.9 C)   Ht 5\' 6"  (1.676 m)   Wt 200 lb (90.7 kg)   SpO2 100%   BMI 32.28 kg/m   Objective:   Physical Exam Vitals and nursing note reviewed.  Constitutional:      Appearance: Normal appearance.  HENT:     Head: Normocephalic.     Comments: 1 inch laceration with 2 staples in place. No drainage. Eyes:     Extraocular Movements: Extraocular movements intact.     Conjunctiva/sclera: Conjunctivae normal.     Pupils: Pupils are equal, round, and reactive to light.  Musculoskeletal:        General: Normal range of motion.     Right lower leg: No edema.     Left lower leg: No edema.  Skin:    General: Skin is warm and dry.     Findings: No lesion or rash.  Neurological:     General: No focal deficit present.     Mental Status: She is alert and oriented to person, place, and time.  Psychiatric:        Mood and Affect: Mood normal.        Behavior: Behavior normal.     Assessment and Plan   1. Encounter for staple removal  2.  Laceration of scalp, subsequent encounter   Staples removed, and laceration healing well.  Call or rto if worsening pain or bleeding.  Return if symptoms worsen or fail to improve.   06/29/2020

## 2020-06-21 DIAGNOSIS — M79671 Pain in right foot: Secondary | ICD-10-CM | POA: Diagnosis not present

## 2020-06-21 DIAGNOSIS — M7731 Calcaneal spur, right foot: Secondary | ICD-10-CM | POA: Diagnosis not present

## 2020-06-21 DIAGNOSIS — M7661 Achilles tendinitis, right leg: Secondary | ICD-10-CM | POA: Diagnosis not present

## 2020-06-29 ENCOUNTER — Encounter: Payer: Self-pay | Admitting: Family Medicine

## 2020-08-28 ENCOUNTER — Other Ambulatory Visit: Payer: Self-pay | Admitting: Family Medicine

## 2020-08-29 ENCOUNTER — Other Ambulatory Visit: Payer: Self-pay | Admitting: Family Medicine

## 2020-09-06 ENCOUNTER — Ambulatory Visit (INDEPENDENT_AMBULATORY_CARE_PROVIDER_SITE_OTHER): Payer: Medicare Other | Admitting: Family Medicine

## 2020-09-06 ENCOUNTER — Encounter: Payer: Self-pay | Admitting: Family Medicine

## 2020-09-06 ENCOUNTER — Other Ambulatory Visit: Payer: Self-pay

## 2020-09-06 VITALS — BP 128/82 | Temp 97.3°F | Wt 203.0 lb

## 2020-09-06 DIAGNOSIS — M7022 Olecranon bursitis, left elbow: Secondary | ICD-10-CM | POA: Diagnosis not present

## 2020-09-06 DIAGNOSIS — J45909 Unspecified asthma, uncomplicated: Secondary | ICD-10-CM

## 2020-09-06 MED ORDER — MONTELUKAST SODIUM 10 MG PO TABS: ORAL_TABLET | ORAL | 1 refills | Status: DC

## 2020-09-06 NOTE — Progress Notes (Signed)
Patient ID: Olivia Huffman, female    DOB: August 24, 1955, 65 y.o.   MRN: [id number]   Chief Complaint  Patient presents with   Elbow Pain   Subjective:    HPI Pt having pain and swelling with left elbow. Pt states the left arm was bruised but bruising has went down. Elbow is swollen with fluid. Going on about one month. Pt has been taking Ibuprofen.   Pt having bursitis in elbow left, bruising resolved.  Fluid in arm.   Putting up wall paper, over 1 mo ago.  Meds- ibuprofen.  Feeling can't lean on it  and still swollen.  Patient only notes discomfort when leaning on the elbow. Ibuprofen works better than tylenol.   Medical History Olivia Huffman has a past medical history of Arthritis, Cancer ([place]), Constipation, Perennial allergic rhinitis, Pneumonia, Reactive airway disease, and Venous stasis (09/13/2013).   Outpatient Encounter Medications as of 09/06/2020  Medication Sig   acetaminophen (TYLENOL) 500 MG tablet Take 1,000 mg by mouth every 6 (six) hours as needed for mild pain or headache.   albuterol (PROVENTIL HFA;VENTOLIN HFA) 108 (90 Base) MCG/ACT inhaler Inhale 2 puffs into the lungs every 4 (four) hours as needed.   clonazePAM (KLONOPIN) 0.5 MG tablet Take 1/2-1 tab po BID prn extreme anxiety   escitalopram (LEXAPRO) 10 MG tablet Take 1 tablet (10 mg total) by mouth every morning.   omeprazole (PRILOSEC) 20 MG capsule Take 20 mg by mouth daily.   polyethylene glycol (MIRALAX / GLYCOLAX) packet Take 17 g by mouth daily.   [DISCONTINUED] montelukast (SINGULAIR) 10 MG tablet TAKE 1 TABLET(10 MG) BY MOUTH AT BEDTIME   montelukast (SINGULAIR) 10 MG tablet TAKE 1 TABLET(10 MG) BY MOUTH AT BEDTIME   No facility-administered encounter medications on file as of 09/06/2020.     Review of Systems  Constitutional:  Negative for chills and fever.  HENT:  Negative for congestion, rhinorrhea and sore throat.   Respiratory:  Negative for cough, shortness of breath and wheezing.    Cardiovascular:  Negative for chest pain and leg swelling.  Gastrointestinal:  Negative for abdominal pain, diarrhea, nausea and vomiting.  Genitourinary:  Negative for dysuria and frequency.  Musculoskeletal:  Positive for arthralgias (left elbow swelling). Negative for back pain.  Skin:  Negative for rash.  Neurological:  Negative for dizziness, weakness and headaches.    Vitals BP 128/82   Temp (!) 97.3 F (36.3 C)   Wt 203 lb (92.1 kg)   BMI 32.77 kg/m   Objective:   Physical Exam Vitals and nursing note reviewed.  Constitutional:      General: She is not in acute distress.    Appearance: Normal appearance.  Musculoskeletal:        General: Swelling present. No tenderness. Normal range of motion.     Comments: +swelling left elbow over olecranon bursa.  No erythema, warmth, or tenderness.  Skin:    General: Skin is warm and dry.     Findings: No rash.  Neurological:     General: No focal deficit present.     Mental Status: She is alert and oriented to person, place, and time.     Cranial Nerves: No cranial nerve deficit.     Sensory: No sensory deficit.     Assessment and Plan   1. Olecranon bursitis of left elbow  2. Chronic asthma without complication, unspecified asthma severity, unspecified whether persistent - montelukast (SINGULAIR) 10 MG tablet; TAKE 1 TABLET(10 MG) BY  MOUTH AT BEDTIME  Dispense: 90 tablet; Refill: 1   Pt has resolving bursitis left olecranon, no sign of infection, normal rom.  - pt taking '400mg'$  ibuprofen 1-2x per day.  Pt to cont this and add ice an compression.  Reminded patient that this might take some time to resolve over a few months.  Call if not improving or worsening pain or redness. Pt in agreement.  Patient has a history of asthma-asking for refill of Singulair.  Return if symptoms worsen or fail to improve.

## 2020-09-25 ENCOUNTER — Ambulatory Visit
Admission: EM | Admit: 2020-09-25 | Discharge: 2020-09-25 | Disposition: A | Discharge status: Home / Self Care | Payer: Medicare Other | Attending: Emergency Medicine | Admitting: Emergency Medicine

## 2020-09-25 ENCOUNTER — Other Ambulatory Visit: Payer: Self-pay

## 2020-09-25 DIAGNOSIS — R21 Rash and other nonspecific skin eruption: Secondary | ICD-10-CM

## 2020-09-25 DIAGNOSIS — J029 Acute pharyngitis, unspecified: Secondary | ICD-10-CM

## 2020-09-25 DIAGNOSIS — J069 Acute upper respiratory infection, unspecified: Secondary | ICD-10-CM | POA: Diagnosis not present

## 2020-09-25 LAB — POCT RAPID STREP A (OFFICE): Rapid Strep A Screen: NEGATIVE

## 2020-09-25 MED ORDER — CETIRIZINE-PSEUDOEPHEDRINE ER 5-120 MG PO TB12: 1.0000 | ORAL_TABLET | Freq: Every day | ORAL | 0 refills | Status: AC

## 2020-09-25 MED ORDER — DOXYCYCLINE HYCLATE 100 MG PO CAPS: 100.0000 mg | ORAL_CAPSULE | Freq: Two times a day (BID) | ORAL | 0 refills | Status: DC

## 2020-09-25 NOTE — ED Provider Notes (Signed)
[place]   [id number] 09/25/20 Arrival Time: 5573  UK:GURK THROAT  SUBJECTIVE: History from: patient.  Olivia Huffman is a 65 y.o. female who presents with abrupt onset of sore throat for the past 2 weeks .  Denies sick exposure to strep, flu or mono, or precipitating event.  Has tried OTC medications without relief.  Symptoms are made worse with swallowing, but tolerating liquids and own secretions without difficulty.  Reports/ denies previous symptoms in the past. Denies fever, chills, fatigue, ear pain, sinus pain, rhinorrhea, nasal congestion, cough, SOB, wheezing, chest pain, nausea, rash, changes in bowel or bladder habits.    She is also complaining of rash on her right upper leg for the past few days.  Report redness around the rash.  Denies any itching    ROS: As per HPI.  All other pertinent ROS negative.      Past Medical History:  Diagnosis Date   Arthritis    Cancer ([place])    melanoma on LLE   Constipation    Perennial allergic rhinitis    Pneumonia    hx of   Reactive airway disease    Venous stasis 09/13/2013   Past Surgical History:  Procedure Laterality Date   COLONOSCOPY     LESION REMOVAL Left 07/16/2013   Procedure: EXCISE MELANOMA OF LOWER LEFT LEG;  Surgeon: Crissie Reese, MD;  Location: [place];  Service: Plastics;  Laterality: Left;   MASS EXCISION Left 09/06/2016   Procedure: BIOPSY/EXCISION MASS NAILBED LEFT THUMB;  Surgeon: Daryll Brod, MD;  Location: [place];  Service: Orthopedics;  Laterality: Left;  REG/FAB   Allergies  Allergen Reactions   Sulfonamide Derivatives     REACTION: hives   No current facility-administered medications on file prior to encounter.   Current Outpatient Medications on File Prior to Encounter  Medication Sig Dispense Refill   acetaminophen (TYLENOL) 500 MG tablet Take 1,000 mg by mouth every 6 (six) hours as needed for mild pain or headache.     albuterol (PROVENTIL HFA;VENTOLIN HFA) 108 (90  Base) MCG/ACT inhaler Inhale 2 puffs into the lungs every 4 (four) hours as needed. 1 Inhaler 0   clonazePAM (KLONOPIN) 0.5 MG tablet Take 1/2-1 tab po BID prn extreme anxiety 20 tablet 1   escitalopram (LEXAPRO) 10 MG tablet Take 1 tablet (10 mg total) by mouth every morning. 90 tablet 1   montelukast (SINGULAIR) 10 MG tablet TAKE 1 TABLET(10 MG) BY MOUTH AT BEDTIME 90 tablet 1   omeprazole (PRILOSEC) 20 MG capsule Take 20 mg by mouth daily.     polyethylene glycol (MIRALAX / GLYCOLAX) packet Take 17 g by mouth daily.     Social History   Socioeconomic History   Marital status: Married    Spouse name: Not on file   Number of children: Not on file   Years of education: Not on file   Highest education level: Not on file  Occupational History   Not on file  Tobacco Use   Smoking status: Never   Smokeless tobacco: Never  Vaping Use   Vaping Use: Never used  Substance and Sexual Activity   Alcohol use: Yes    Comment: occasional   Drug use: No   Sexual activity: Not on file  Other Topics Concern   Not on file  Social History Narrative   Not on file   Social Determinants of Health   Financial Resource Strain: Not on file  Food Insecurity: Not on  file  Transportation Needs: Not on file  Physical Activity: Not on file  Stress: Not on file  Social Connections: Not on file  Intimate Partner Violence: Not on file   Family History  Problem Relation Age of Onset   Diabetes Mother    Hypertension Mother     OBJECTIVE:  Vitals:   09/25/20 0826  BP: (!) 145/84  Pulse: 65  Resp: 16  Temp: 98.7 F (37.1 C)  TempSrc: Oral  SpO2: 95%     General appearance: alert; appears fatigued, but nontoxic, speaking in full sentences and managing own secretions HEENT: NCAT; Ears: EACs clear, TMs pearly gray with visible cone of light, without erythema; Eyes: PERRL, EOMI grossly; Nose: no obvious rhinorrhea; Throat: oropharynx clear, tonsils 1+ and mildly erythematous without white  tonsillar exudates, uvula midline Neck: supple without LAD Lungs: CTA bilaterally without adventitious breath sounds; cough absent Heart: regular rate and rhythm.  Radial pulses 2+ symmetrical bilaterally Skin: warm and dry.insect bite rash on right upper leg Psychological: alert and cooperative; normal mood and affect  LABS: Results for orders placed or performed during the hospital encounter of 09/25/20 (from the past 24 hour(s))  POCT rapid strep A     Status: None   Collection Time: 09/25/20  8:30 AM  Result Value Ref Range   Rapid Strep A Screen Negative Negative     ASSESSMENT & PLAN:  1. Sore throat   2. Acute upper respiratory infection   3. Rash and nonspecific skin eruption     Meds ordered this encounter  Medications   doxycycline (VIBRAMYCIN) 100 MG capsule    Sig: Take 1 capsule (100 mg total) by mouth 2 (two) times daily.    Dispense:  20 capsule    Refill:  0   cetirizine-pseudoephedrine (ZYRTEC-D) 5-120 MG tablet    Sig: Take 1 tablet by mouth daily.    Dispense:  30 tablet    Refill:  0   Discharge Instructions   Strep test negative, will send out for culture and we will call you with results Declines test for mono at this time Get plenty of rest and push fluids Doxycycline was prescribed Zyrtec D prescribed. Use daily for symptomatic relief   Drink warm or cool liquids, use throat lozenges, or popsicles to help alleviate symptoms Take OTC ibuprofen or tylenol as needed for pain Follow up with PCP if symptoms persists Return or go to ER if patient has any new or worsening symptoms such as fever, chills, nausea, vomiting, worsening sore throat, cough, abdominal pain, chest pain, changes in bowel or bladder habits, etc...  Reviewed expectations re: course of current medical issues. Questions answered. Outlined signs and symptoms indicating need for more acute intervention. Patient verbalized understanding. After Visit Summary given.          Emerson Monte, [place] 09/25/20 [phone]

## 2020-09-25 NOTE — ED Triage Notes (Signed)
Patient presents to Urgent Care with complaints of sore throat x 2 weeks. Pt states 2 negative covid tests. Also concerned with a rash on her right leg. Treating symptoms with her allergy meds and cold/cough meds.   Denies fever.

## 2020-09-25 NOTE — Discharge Instructions (Signed)
Strep test negative, will send out for culture and we will call you with results Declines test for mono at this time Get plenty of rest and push fluids Doxycycline was prescribed Zyrtec D prescribed. Use daily for symptomatic relief   Drink warm or cool liquids, use throat lozenges, or popsicles to help alleviate symptoms Take OTC ibuprofen or tylenol as needed for pain Follow up with PCP if symptoms persists Return or go to ER if patient has any new or worsening symptoms such as fever, chills, nausea, vomiting, worsening sore throat, cough, abdominal pain, chest pain, changes in bowel or bladder habits, etc..Marland Kitchen

## 2020-09-28 LAB — CULTURE, GROUP A STREP (THRC)

## 2020-11-17 ENCOUNTER — Other Ambulatory Visit: Payer: Self-pay | Admitting: Nurse Practitioner

## 2020-11-29 ENCOUNTER — Other Ambulatory Visit: Payer: Self-pay | Admitting: Family Medicine

## 2020-11-29 DIAGNOSIS — B078 Other viral warts: Secondary | ICD-10-CM | POA: Diagnosis not present

## 2020-11-29 DIAGNOSIS — C44729 Squamous cell carcinoma of skin of left lower limb, including hip: Secondary | ICD-10-CM | POA: Diagnosis not present

## 2020-11-29 DIAGNOSIS — X32XXXD Exposure to sunlight, subsequent encounter: Secondary | ICD-10-CM | POA: Diagnosis not present

## 2020-11-29 DIAGNOSIS — L570 Actinic keratosis: Secondary | ICD-10-CM | POA: Diagnosis not present

## 2020-11-29 MED ORDER — ESCITALOPRAM OXALATE 10 MG PO TABS: 10.0000 mg | ORAL_TABLET | Freq: Every morning | ORAL | 0 refills | Status: DC

## 2020-11-29 NOTE — Telephone Encounter (Signed)
Patient has appointment 12/5 and needs escitalopram refilled before appointment. She is completley out. Please advise.  [place]  CB#  [phone]

## 2020-12-05 ENCOUNTER — Encounter: Payer: Self-pay | Admitting: Nurse Practitioner

## 2020-12-05 ENCOUNTER — Other Ambulatory Visit: Payer: Self-pay

## 2020-12-05 ENCOUNTER — Ambulatory Visit (INDEPENDENT_AMBULATORY_CARE_PROVIDER_SITE_OTHER): Payer: Medicare Other | Admitting: Nurse Practitioner

## 2020-12-05 VITALS — BP 130/72 | HR 73 | Temp 97.5°F | Ht 66.0 in | Wt 205.6 lb

## 2020-12-05 DIAGNOSIS — S81802A Unspecified open wound, left lower leg, initial encounter: Secondary | ICD-10-CM | POA: Diagnosis not present

## 2020-12-05 NOTE — Progress Notes (Signed)
   Subjective:    Patient ID: Olivia Huffman, female    DOB: January 05, 1955, 65 y.o.   MRN: [id number]  HPI Pt here for medication refill/review. Pt taking all meds as prescribed. Taking Lexapro daily with no side effects. Patient had Lexapro refilled with a 90 day prescription on 11/29/2020. No refills needed at this time.  Pt would like provider to look at left leg. Pt states that she was seen by dermatology and had an area suspicious for melanoma "burned" out last week. Area is covered by a band-aid today and patient wants to know if it is starting to look infected. Patient states that area is draining a clear fluid.   Review of Systems  Constitutional: Negative.  Negative for chills and fever.  HENT: Negative.    Eyes: Negative.   Respiratory: Negative.    Cardiovascular: Negative.   Gastrointestinal: Negative.   Endocrine: Negative.   Genitourinary: Negative.   Musculoskeletal: Negative.        Wound to left  leg draining.  Skin: Negative.   Allergic/Immunologic: Negative.   Neurological: Negative.   Hematological: Negative.   Psychiatric/Behavioral: Negative.        Objective:   Physical Exam Constitutional:      General: She is not in acute distress.    Appearance: She is obese. She is not ill-appearing or toxic-appearing.  Cardiovascular:     Rate and Rhythm: Normal rate and regular rhythm.     Pulses: Normal pulses.     Heart sounds: Normal heart sounds. No murmur heard. Pulmonary:     Effort: Pulmonary effort is normal. No respiratory distress.     Breath sounds: Normal breath sounds. No stridor. No wheezing, rhonchi or rales.  Chest:     Chest wall: No tenderness.  Musculoskeletal:        General: Normal range of motion.     Right lower leg: Normal. No swelling, deformity, lacerations, tenderness or bony tenderness. No edema.     Left lower leg: Laceration present. No swelling, deformity, tenderness or bony tenderness. No edema.       Legs:  Skin:    General:  Skin is warm.  Neurological:     General: No focal deficit present.     Mental Status: She is alert and oriented to person, place, and time.  Psychiatric:        Mood and Affect: Mood normal.        Behavior: Behavior normal.          Assessment & Plan:  1. Wound of left lower extremity, initial encounter - Area seems to be healing well. - No S/S of infection noted at this time. - Use pen to mark current areas that are red and track any changes noted. - Follow with clinic or dermatology if area becomes increasingly red, warm to touch, or increased drainage. - May consider abs if medically necessary. - Follow up in 4 weeks for Wellness visit and assessment of area to left leg.  - Will refill medications at wellness visit in 4 weeks.

## 2020-12-13 DIAGNOSIS — M25522 Pain in left elbow: Secondary | ICD-10-CM | POA: Diagnosis not present

## 2020-12-13 DIAGNOSIS — M7022 Olecranon bursitis, left elbow: Secondary | ICD-10-CM | POA: Diagnosis not present

## 2020-12-13 DIAGNOSIS — M7712 Lateral epicondylitis, left elbow: Secondary | ICD-10-CM | POA: Diagnosis not present

## 2021-01-06 ENCOUNTER — Ambulatory Visit (INDEPENDENT_AMBULATORY_CARE_PROVIDER_SITE_OTHER): Payer: Medicare Other | Admitting: Nurse Practitioner

## 2021-01-06 ENCOUNTER — Other Ambulatory Visit: Payer: Self-pay

## 2021-01-06 VITALS — BP 132/84 | HR 65 | Temp 98.2°F | Ht 66.0 in | Wt 206.0 lb

## 2021-01-06 DIAGNOSIS — Z0001 Encounter for general adult medical examination with abnormal findings: Secondary | ICD-10-CM

## 2021-01-06 DIAGNOSIS — Z01419 Encounter for gynecological examination (general) (routine) without abnormal findings: Secondary | ICD-10-CM

## 2021-01-06 DIAGNOSIS — L578 Other skin changes due to chronic exposure to nonionizing radiation: Secondary | ICD-10-CM

## 2021-01-06 DIAGNOSIS — Z0000 Encounter for general adult medical examination without abnormal findings: Secondary | ICD-10-CM

## 2021-01-06 DIAGNOSIS — E2839 Other primary ovarian failure: Secondary | ICD-10-CM

## 2021-01-06 DIAGNOSIS — Z1211 Encounter for screening for malignant neoplasm of colon: Secondary | ICD-10-CM | POA: Diagnosis not present

## 2021-01-06 DIAGNOSIS — Z23 Encounter for immunization: Secondary | ICD-10-CM

## 2021-01-06 DIAGNOSIS — Z780 Asymptomatic menopausal state: Secondary | ICD-10-CM

## 2021-01-06 NOTE — Progress Notes (Signed)
Subjective:    Patient ID: Olivia Huffman, female    DOB: November 13, 1955, 66 y.o.   MRN: [id number]  HPI AWV- Annual Wellness Visit  The patient was seen for their annual wellness visit. The patient's past medical history, surgical history, and family history were reviewed. Pertinent vaccines were reviewed ( tetanus, pneumonia, shingles, flu) The patient's medication list was reviewed and updated.  The height and weight were entered.  BMI recorded in electronic record elsewhere  Cognitive screening was completed. Outcome of Mini - Cog: 5   Falls /depression screening electronically recorded within record elsewhere  Current tobacco usage:no (All patients who use tobacco were given written and verbal information on quitting)  Recent listing of emergency department/hospitalizations over the past year were reviewed.  current specialist the patient sees on a regular basis: dermatology   Medicare annual wellness visit patient questionnaire was reviewed.  A written screening schedule for the patient for the next 5-10 years was given. Appropriate discussion of followup regarding next visit was discussed.  Due for colonoscopy. Has had mammogram this year.  Stays physically active with grandchildren. Has not done as well with her diet lately especially during the holidays. Same sexual partner.  Regular vision and dental exams.  Depression screen [facility] 2/9 01/06/2021  Decreased Interest 0  Down, Depressed, Hopeless 0  PHQ - 2 Score 0  Altered sleeping 0  Tired, decreased energy 0  Change in appetite 0  Feeling bad or failure about yourself  0  Trouble concentrating 0  Moving slowly or fidgety/restless 0  Suicidal thoughts 0  PHQ-9 Score 0  Difficult doing work/chores Not difficult at all        Review of Systems  Constitutional:  Negative for activity change, appetite change and fatigue.  HENT:  Negative for sore throat and trouble swallowing.   Respiratory:  Negative for  cough, chest tightness, shortness of breath and wheezing.   Cardiovascular:  Negative for chest pain.  Gastrointestinal:  Negative for abdominal distention, abdominal pain, constipation, diarrhea, nausea and vomiting.  Genitourinary:  Negative for difficulty urinating, dysuria, enuresis, frequency, genital sores, pelvic pain, urgency, vaginal bleeding and vaginal discharge.      Objective:   Physical Exam Constitutional:      General: She is not in acute distress.    Appearance: She is well-developed.  Neck:     Thyroid: No thyromegaly.     Trachea: No tracheal deviation.     Comments: Thyroid non tender to palpation. No mass or goiter noted.  Cardiovascular:     Rate and Rhythm: Normal rate and regular rhythm.     Heart sounds: Normal heart sounds. No murmur heard. Pulmonary:     Effort: Pulmonary effort is normal.     Breath sounds: Normal breath sounds.  Chest:  Breasts:    Right: No swelling, inverted nipple, mass, skin change or tenderness.     Left: No swelling, inverted nipple, mass, skin change or tenderness.  Abdominal:     General: There is no distension.     Palpations: Abdomen is soft.     Tenderness: There is no abdominal tenderness.  Genitourinary:    Comments: Defers pelvic exam; denies any problems.  Musculoskeletal:     Cervical back: Normal range of motion and neck supple.  Lymphadenopathy:     Cervical: No cervical adenopathy.     Upper Body:     Right upper body: No supraclavicular, axillary or pectoral adenopathy.     Left upper  body: No supraclavicular, axillary or pectoral adenopathy.  Skin:    General: Skin is warm and dry.     Comments: Significant generalized sun damage. Healing wound from recent biopsy on lower leg.   Neurological:     Mental Status: She is alert and oriented to person, place, and time.  Psychiatric:        Mood and Affect: Mood normal.        Behavior: Behavior normal.        Thought Content: Thought content normal.         Judgment: Judgment normal.   Today's Vitals   01/06/21 1308  BP: 132/84  Pulse: 65  Temp: 98.2 F (36.8 C)  SpO2: 97%  Weight: 206 lb (93.4 kg)  Height: 5\' 6"  (1.676 m)   Body mass index is 33.25 kg/m.        Assessment & Plan:   Problem List Items Addressed This Visit       Musculoskeletal and Integument   Sun-damaged skin   Other Visit Diagnoses     Encounter for Medicare annual wellness exam    -  Primary   Well woman exam       Post-menopausal       Relevant Orders   DG Bone Density   Immunization due       Relevant Orders   Pneumococcal conjugate vaccine 20-valent (Prevnar 20) (Completed)   Postmenopausal       Relevant Orders   DG Bone Density   Screen for colon cancer       Relevant Orders   Ambulatory referral to Gastroenterology        Continue follow up with dermatology. Referred for screening colonoscopy. Bone density ordered.  Discussed healthy diet with a goal of getting back under 200 lbs.  Recommend shingles vaccines.  Prevnar 20 given today. Patient wishes to make lifestyle changes and repeat labs at next visit. Return in about 6 months (around 07/06/2021).

## 2021-01-06 NOTE — Telephone Encounter (Signed)
Sent message to schedule appointment 01/06/21

## 2021-01-06 NOTE — Patient Instructions (Signed)
Recommend Shingles vaccine at local pharmacy Will refer you for your colonoscopy.

## 2021-01-07 ENCOUNTER — Encounter: Payer: Self-pay | Admitting: Nurse Practitioner

## 2021-01-07 DIAGNOSIS — L570 Actinic keratosis: Secondary | ICD-10-CM | POA: Insufficient documentation

## 2021-01-07 DIAGNOSIS — L578 Other skin changes due to chronic exposure to nonionizing radiation: Secondary | ICD-10-CM | POA: Insufficient documentation

## 2021-01-09 ENCOUNTER — Ambulatory Visit
Admission: RE | Admit: 2021-01-09 | Discharge: 2021-01-09 | Disposition: A | Discharge status: Home / Self Care | Payer: Medicare Other | Source: Ambulatory Visit | Attending: Nurse Practitioner | Admitting: Nurse Practitioner

## 2021-01-09 DIAGNOSIS — Z780 Asymptomatic menopausal state: Secondary | ICD-10-CM | POA: Diagnosis not present

## 2021-01-09 NOTE — Telephone Encounter (Signed)
Had appointment on 01/06/2021

## 2021-02-14 ENCOUNTER — Telehealth: Payer: Self-pay | Admitting: Family Medicine

## 2021-02-14 NOTE — Telephone Encounter (Signed)
°  Left message for patient to call back and schedule Medicare Annual Wellness Visit (AWV) in office.   If unable to come into the office for AWV,  please offer to do virtually or by telephone.  No hx of AWV eligible for AWVI as of  08/01/2020 per [facility]  Please schedule at anytime with [facility].      40 Minutes appointment   Any questions, please call me at [phone]

## 2021-02-23 ENCOUNTER — Other Ambulatory Visit: Payer: Self-pay | Admitting: Family Medicine

## 2021-03-23 ENCOUNTER — Telehealth: Payer: Self-pay | Admitting: Family Medicine

## 2021-03-23 NOTE — Telephone Encounter (Signed)
?  Left message for patient to call back and schedule Medicare Annual Wellness Visit (AWV) in office.  ? ?If unable to come into the office for AWV,  please offer to do virtually or by telephone. ? ?No hx of AWV eligible for AWVI per [employer] as of 08/01/2020  ? ?Please schedule at anytime with [facility].     ? ?45 minute appointment  ? ?Any questions, please call me at [phone]   ?

## 2021-03-31 ENCOUNTER — Other Ambulatory Visit: Payer: Self-pay | Admitting: Family Medicine

## 2021-03-31 ENCOUNTER — Other Ambulatory Visit: Payer: Self-pay

## 2021-03-31 DIAGNOSIS — J45909 Unspecified asthma, uncomplicated: Secondary | ICD-10-CM

## 2021-03-31 MED ORDER — MONTELUKAST SODIUM 10 MG PO TABS: ORAL_TABLET | ORAL | 0 refills | Status: DC

## 2021-05-19 ENCOUNTER — Other Ambulatory Visit: Payer: Self-pay | Admitting: Family Medicine

## 2021-05-19 DIAGNOSIS — J45909 Unspecified asthma, uncomplicated: Secondary | ICD-10-CM

## 2021-05-24 ENCOUNTER — Other Ambulatory Visit: Payer: Self-pay | Admitting: Family Medicine

## 2021-08-18 ENCOUNTER — Ambulatory Visit (INDEPENDENT_AMBULATORY_CARE_PROVIDER_SITE_OTHER): Payer: Medicare Other | Admitting: Nurse Practitioner

## 2021-08-18 ENCOUNTER — Encounter: Payer: Self-pay | Admitting: Nurse Practitioner

## 2021-08-18 VITALS — BP 142/90 | HR 70 | Temp 98.1°F | Ht 66.0 in | Wt 209.0 lb

## 2021-08-18 DIAGNOSIS — H8110 Benign paroxysmal vertigo, unspecified ear: Secondary | ICD-10-CM | POA: Diagnosis not present

## 2021-08-18 DIAGNOSIS — J45909 Unspecified asthma, uncomplicated: Secondary | ICD-10-CM

## 2021-08-18 DIAGNOSIS — F411 Generalized anxiety disorder: Secondary | ICD-10-CM

## 2021-08-18 MED ORDER — ESCITALOPRAM OXALATE 10 MG PO TABS: ORAL_TABLET | ORAL | 1 refills | Status: DC

## 2021-08-18 MED ORDER — MONTELUKAST SODIUM 10 MG PO TABS: ORAL_TABLET | ORAL | 1 refills | Status: DC

## 2021-08-18 MED ORDER — MECLIZINE HCL 25 MG PO TABS: ORAL_TABLET | ORAL | 0 refills | Status: AC

## 2021-08-18 NOTE — Patient Instructions (Signed)
Epley maneuver: you tube Faugueir ENT (for BPV)  Benign Positional Vertigo Vertigo is the feeling that you or your surroundings are moving when they are not. Benign positional vertigo is the most common form of vertigo. This is usually a harmless condition (benign). This condition is positional. This means that symptoms are triggered by certain movements and positions. This condition can be dangerous if it occurs while you are doing something that could cause harm to yourself or others. This includes activities such as driving or operating machinery. What are the causes? The inner ear has fluid-filled canals that help your brain sense movement and balance. When the fluid moves, the brain receives messages about your body's position. With benign positional vertigo, calcium crystals in the inner ear break free and disturb the inner ear area. This causes your brain to receive confusing messages about your body's position. What increases the risk? You are more likely to develop this condition if: You are a woman. You are 63 years of age or older. You have recently had a head injury. You have an inner ear disease. What are the signs or symptoms? Symptoms of this condition usually happen when you move your head or your eyes in different directions. Symptoms may start suddenly and usually last for less than a minute. They include: Loss of balance and falling. Feeling like you are spinning or moving. Feeling like your surroundings are spinning or moving. Nausea and vomiting. Blurred vision. Dizziness. Involuntary eye movement (nystagmus). Symptoms can be mild and cause only minor problems, or they can be severe and interfere with daily life. Episodes of benign positional vertigo may return (recur) over time. Symptoms may also improve over time. How is this diagnosed? This condition may be diagnosed based on: Your medical history. A physical exam of the head, neck, and ears. Positional tests to  check for or stimulate vertigo. You may be asked to turn your head and change positions, such as going from sitting to lying down. A health care provider will watch for symptoms of vertigo. You may be referred to a health care provider who specializes in ear, nose, and throat problems (ENT or otolaryngologist) or a provider who specializes in disorders of the nervous system (neurologist). How is this treated?  This condition may be treated in a session in which your health care provider moves your head in specific positions to help the displaced crystals in your inner ear move. Treatment for this condition may take several sessions. Surgery may be needed in severe cases, but this is rare. In some cases, benign positional vertigo may resolve on its own in 2-4 weeks. Follow these instructions at home: Safety Move slowly. Avoid sudden body or head movements or certain positions, as told by your health care provider. Avoid driving or operating machinery until your health care provider says it is safe. Avoid doing any tasks that would be dangerous to you or others if vertigo occurs. If you have trouble walking or keeping your balance, try using a cane for stability. If you feel dizzy or unstable, sit down right away. Return to your normal activities as told by your health care provider. Ask your health care provider what activities are safe for you. General instructions Take over-the-counter and prescription medicines only as told by your health care provider. Drink enough fluid to keep your urine pale yellow. Keep all follow-up visits. This is important. Contact a health care provider if: You have a fever. Your condition gets worse or you develop new  symptoms. Your family or friends notice any behavioral changes. You have nausea or vomiting that gets worse. You have numbness or a prickling and tingling sensation. Get help right away if you: Have difficulty speaking or moving. Are always dizzy or  faint. Develop severe headaches. Have weakness in your legs or arms. Have changes in your hearing or vision. Develop a stiff neck. Develop sensitivity to light. These symptoms may represent a serious problem that is an emergency. Do not wait to see if the symptoms will go away. Get medical help right away. Call your local emergency services (911 in the [place] Do not drive yourself to the hospital. Summary Vertigo is the feeling that you or your surroundings are moving when they are not. Benign positional vertigo is the most common form of vertigo. This condition is caused by calcium crystals in the inner ear that become displaced. This causes a disturbance in an area of the inner ear that helps your brain sense movement and balance. Symptoms include loss of balance and falling, feeling that you or your surroundings are moving, nausea and vomiting, and blurred vision. This condition can be diagnosed based on symptoms, a physical exam, and positional tests. Follow safety instructions as told by your health care provider and keep all follow-up visits. This is important. This information is not intended to replace advice given to you by your health care provider. Make sure you discuss any questions you have with your health care provider. Document Revised: 11/18/2019 Document Reviewed: 11/18/2019 Elsevier Patient Education  [employer].

## 2021-08-18 NOTE — Progress Notes (Signed)
Subjective:    Patient ID: Olivia Huffman, female    DOB: 30-Apr-1955, 66 y.o.   MRN: [id number]  HPI Patient is here for 6 month follow up on chronic medical conditions No anxiety or depression on screening Reported some dizziness going on for 1 month  States her anxiety has been stable on escitalopram.  Has used very few of her Klonopin.  Was not sure her escitalopram was working so stopped it for about a week and realized that her symptoms had come back and has been back on it since then. Complaints of dizziness that began about a month ago.  Has a spinning sensation when she turns over in bed, gets out of bed or chair, changing position including bending.  Occurs sporadically.  Does not occur all the time.  No nausea or vomiting.  No ear pain but pressure at times.  No sore throat or cough.  Ethmoid sinus area pressure mainly in the mornings at times.  No fevers.  No visual changes.  No numbness or weakness of the face arms or legs.  No difficulty speaking or swallowing.  Had a similar problem years ago but not quite as severe. In addition patient would like to try [employer] weight loss program.  Has a friend that has been very successful over the past month.  Has a very active lifestyle.  Retired, trying to do some part-time work to make sure she gets out of the house.  Review of Systems  Constitutional:  Negative for fever.  HENT:  Positive for sinus pressure. Negative for ear pain and sore throat.   Respiratory:  Negative for cough, chest tightness and shortness of breath.   Cardiovascular:  Negative for chest pain.  Neurological:  Positive for dizziness. Negative for syncope, facial asymmetry, speech difficulty, weakness, numbness and headaches.       Objective:   Physical Exam NAD.  Alert, oriented.  Calm cheerful affect.  TMs retracted bilaterally, no erythema.  Pharynx minimally injected, mucous membranes moist.  Neck supple with mild soft anterior adenopathy.  Lungs clear.  Heart  regular rate rhythm.  No murmur or gallop noted.  EOMs grossly intact, maneuvers produced dizziness.  Speech clear.  Face symmetrical.  Hand strength 5+ bilaterally.  Reflexes normal limit upper and lower extremities.  Romberg negative.  Epley maneuvers performed on both left and right side.  The right side produced the most dizziness but symptoms were much better after the maneuvers.  Gait steady. Today's Vitals   08/18/21 1336 08/18/21 1400 08/18/21 1405 08/18/21 1410  BP: 121/66 (!) 142/96 (!) 144/90 (!) 142/90  Pulse: 70     Temp: 98.1 F (36.7 C)     SpO2: 97%     Weight: 209 lb (94.8 kg)     Height: '5\' 6"'$  (1.676 m)      Body mass index is 33.73 kg/m.        Assessment & Plan:   Problem List Items Addressed This Visit       Respiratory   Asthma, chronic   Relevant Medications   montelukast (SINGULAIR) 10 MG tablet     Nervous and Auditory   Benign paroxysmal positional vertigo - Primary     Other   Generalized anxiety disorder   Relevant Medications   escitalopram (LEXAPRO) 10 MG tablet   Meds ordered this encounter  Medications   montelukast (SINGULAIR) 10 MG tablet    Sig: TAKE 1 TABLET(10 MG) BY MOUTH AT BEDTIME  Dispense:  90 tablet    Refill:  1   escitalopram (LEXAPRO) 10 MG tablet    Sig: TAKE 1 TABLET(10 MG) BY MOUTH EVERY MORNING    Dispense:  90 tablet    Refill:  1   meclizine (ANTIVERT) 25 MG tablet    Sig: Take one tab po TID prn dizziness    Dispense:  30 tablet    Refill:  0    Order Specific Question:   Supervising Provider    Answer:   Sallee Lange A [[id number]]   Continue Singulair as directed. Continue escitalopram 10 mg daily. Add meclizine to her regimen, cautioned about potential adverse effects. Given written and verbal information on benign positional vertigo.  Also information on YouTube video showing Epley maneuver that she can perform at home.  Discussed warning signs.  Expect gradual resolution.  Call back if worsens or  persist. Return in about 6 months (around 02/18/2022).

## 2021-08-29 DIAGNOSIS — Z1283 Encounter for screening for malignant neoplasm of skin: Secondary | ICD-10-CM | POA: Diagnosis not present

## 2021-08-29 DIAGNOSIS — D225 Melanocytic nevi of trunk: Secondary | ICD-10-CM | POA: Diagnosis not present

## 2021-08-29 DIAGNOSIS — L570 Actinic keratosis: Secondary | ICD-10-CM | POA: Diagnosis not present

## 2021-08-29 DIAGNOSIS — X32XXXD Exposure to sunlight, subsequent encounter: Secondary | ICD-10-CM | POA: Diagnosis not present

## 2021-08-29 DIAGNOSIS — Z85820 Personal history of malignant melanoma of skin: Secondary | ICD-10-CM | POA: Diagnosis not present

## 2021-08-29 DIAGNOSIS — Z08 Encounter for follow-up examination after completed treatment for malignant neoplasm: Secondary | ICD-10-CM | POA: Diagnosis not present

## 2021-10-21 IMAGING — MG DIGITAL DIAGNOSTIC BILAT W/ TOMO W/ CAD
6 of 10 series · 6 of 30 positions shown · non-contrast
Comparison: Previous exam(s).

CLINICAL DATA: 64-year-old female with diffuse bilateral breast
pain for 3 months, now significantly improved.

EXAM:
DIGITAL DIAGNOSTIC BILATERAL MAMMOGRAM WITH TOMOSYNTHESIS AND CAD
TECHNIQUE: Bilateral digital diagnostic mammography and breast tomosynthesis
was performed. The images were evaluated with computer-aided
detection.

[L CC synth-2D]
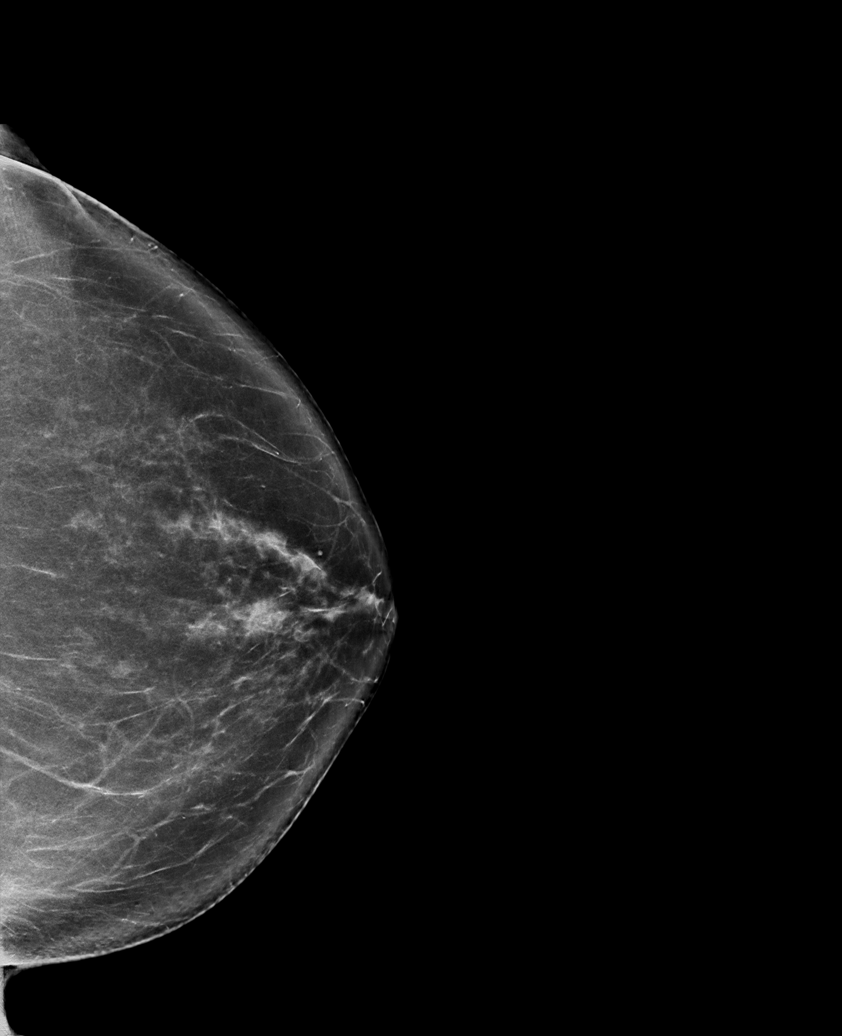

[R CC synth-2D]
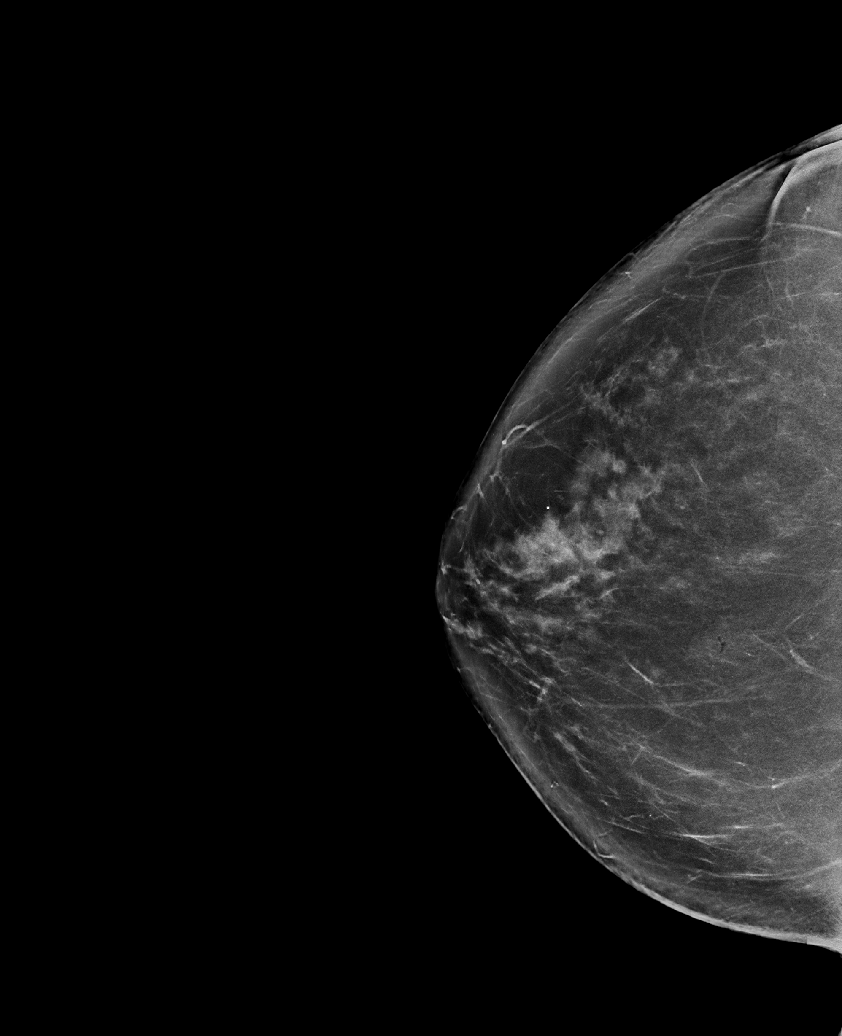

[L MLO synth-2D]
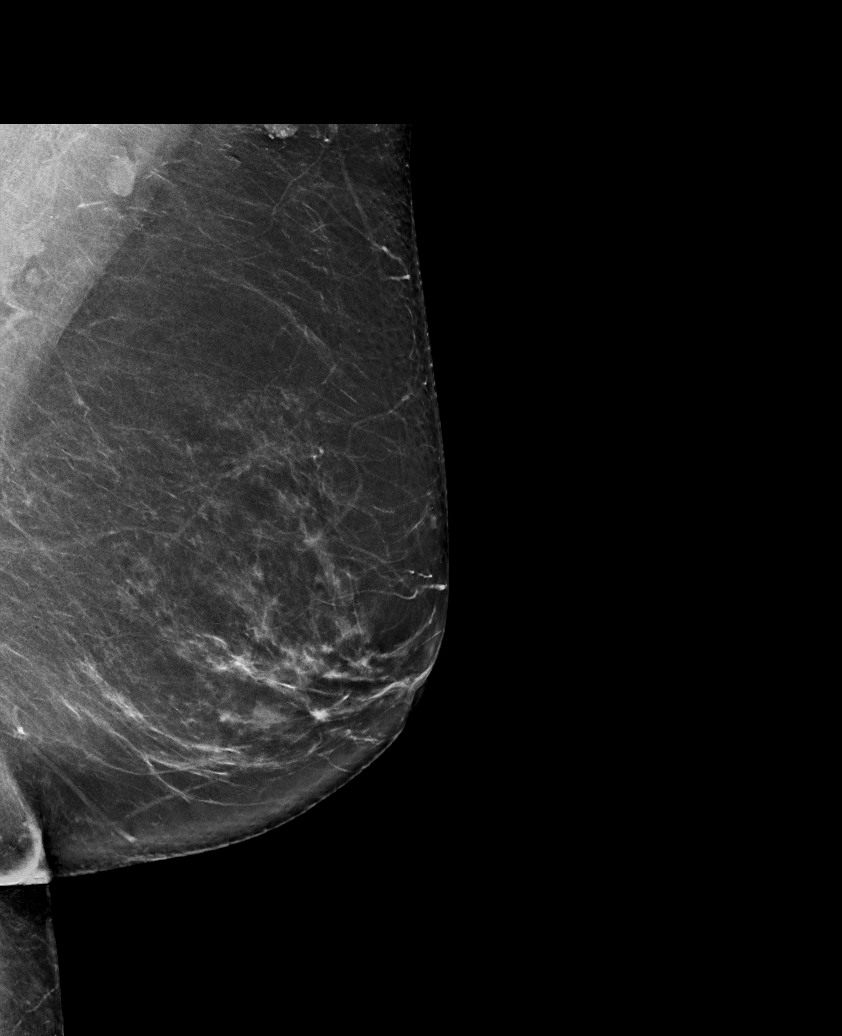

[R MLO synth-2D (1 of 2)]
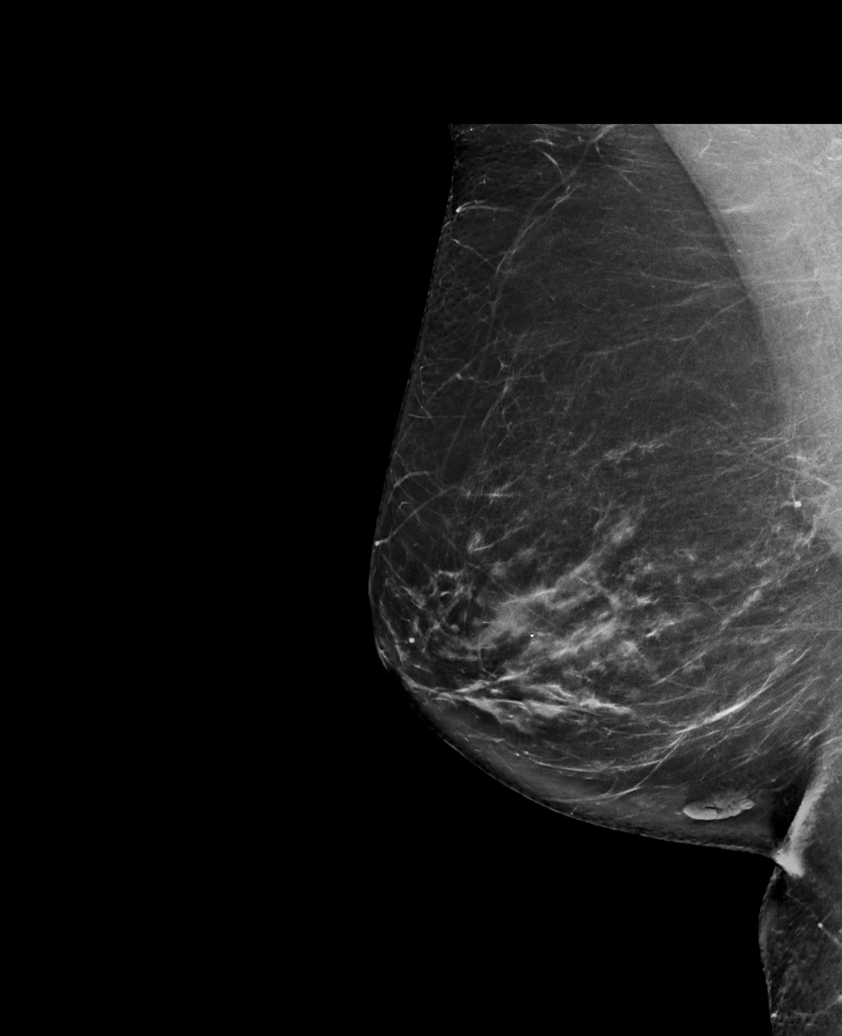

[R MLO synth-2D (2 of 2)]
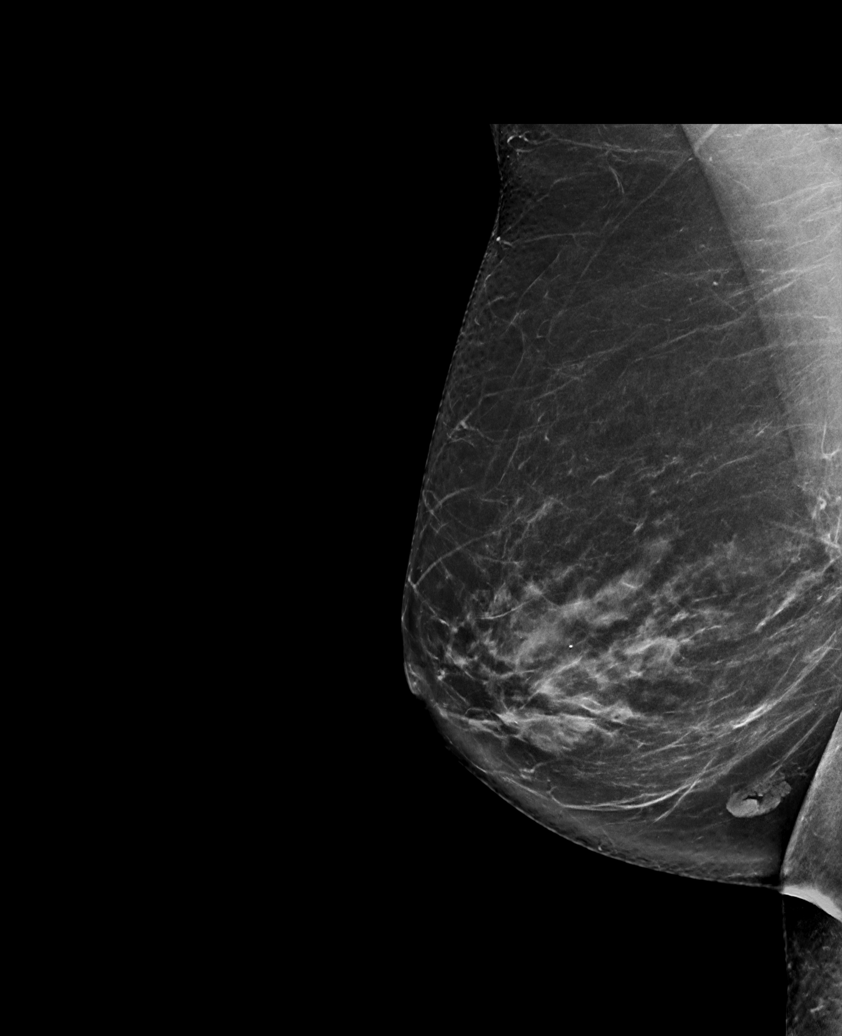

[L MLO tomo · tomo slice 45/88.0]
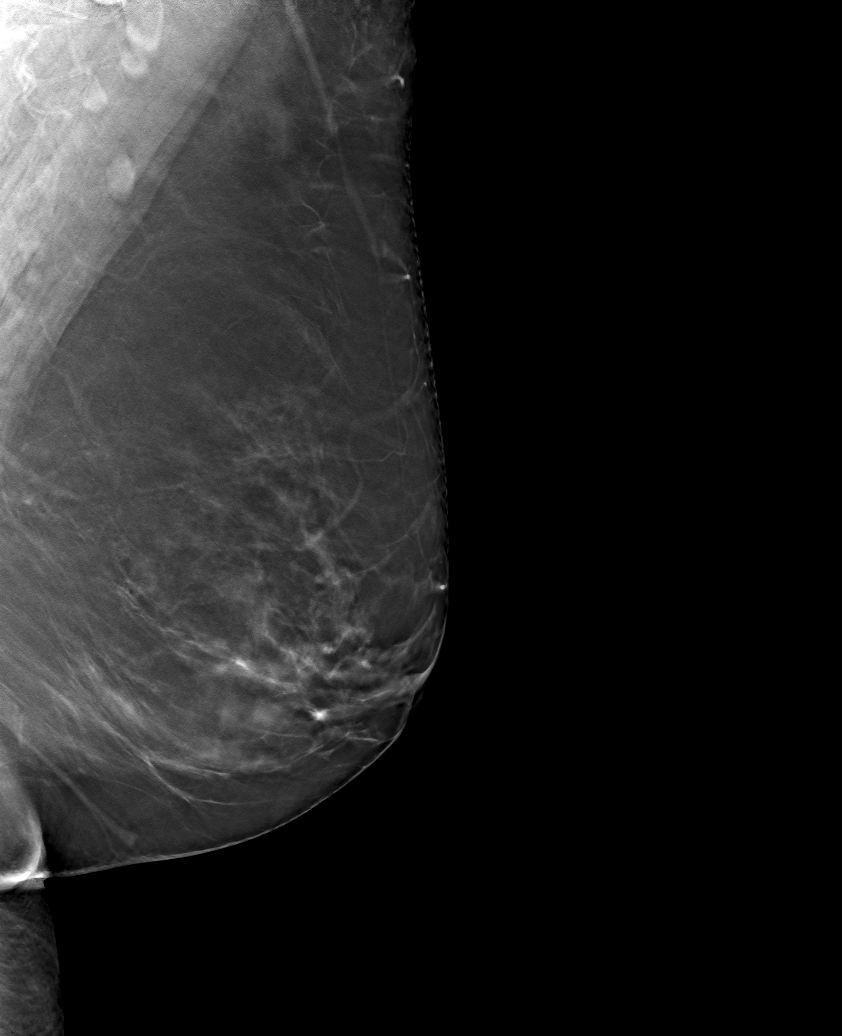

[6 of 30 positions shown; findings below may reference images not displayed]

ACR Breast Density Category b: There are scattered areas of
fibroglandular density.
FINDINGS: There are no suspicious mammographic findings in either breast. The
parenchymal pattern is stable.
IMPRESSION: No mammographic evidence of malignancy in either breast.

RECOMMENDATION:
1. Clinical follow-up recommended for the patient's bilateral
diffuse breast pain. Any further workup should be based on clinical
grounds.
2.  Screening mammogram in one year.(Code:KP-2-WCB)

I have discussed the findings and recommendations with the patient.
If applicable, a reminder letter will be sent to the patient
regarding the next appointment.

BI-RADS CATEGORY  1: Negative.

## 2021-10-30 ENCOUNTER — Ambulatory Visit (HOSPITAL_COMMUNITY)
Admission: RE | Admit: 2021-10-30 | Discharge: 2021-10-30 | Disposition: A | Discharge status: Home / Self Care | Payer: Medicare Other | Source: Ambulatory Visit | Attending: Family Medicine | Admitting: Family Medicine

## 2021-10-30 ENCOUNTER — Encounter: Payer: Self-pay | Admitting: Family Medicine

## 2021-10-30 ENCOUNTER — Ambulatory Visit (INDEPENDENT_AMBULATORY_CARE_PROVIDER_SITE_OTHER): Payer: Medicare Other | Admitting: Family Medicine

## 2021-10-30 VITALS — BP 135/81 | HR 60 | Temp 98.7°F | Wt 206.8 lb

## 2021-10-30 DIAGNOSIS — R053 Chronic cough: Secondary | ICD-10-CM | POA: Insufficient documentation

## 2021-10-30 DIAGNOSIS — H6592 Unspecified nonsuppurative otitis media, left ear: Secondary | ICD-10-CM | POA: Diagnosis not present

## 2021-10-30 DIAGNOSIS — H6590 Unspecified nonsuppurative otitis media, unspecified ear: Secondary | ICD-10-CM | POA: Insufficient documentation

## 2021-10-30 DIAGNOSIS — R059 Cough, unspecified: Secondary | ICD-10-CM | POA: Diagnosis not present

## 2021-10-30 MED ORDER — AMOXICILLIN-POT CLAVULANATE 875-125 MG PO TABS: 1.0000 | ORAL_TABLET | Freq: Two times a day (BID) | ORAL | 0 refills | Status: DC

## 2021-10-30 NOTE — Assessment & Plan Note (Signed)
Treating with Augmentin. 

## 2021-10-30 NOTE — Assessment & Plan Note (Signed)
Chest x-ray was obtained and was independently reviewed by me.  Chest: Normal x-ray.  No evidence of infiltrate.  Augmentin as prescribed.

## 2021-10-30 NOTE — Progress Notes (Signed)
Subjective:  Patient ID: Olivia Huffman, female    DOB: April 04, 1955  Age: 66 y.o. MRN: [id number]  CC: Chief Complaint  Patient presents with   Adenopathy    Pt having swelling on lymph nodes in back of head. Causing pain into shoulder and neck. Pt had head cold a few weeks ago.     HPI:  66 year old female presents for evaluation of the above.  Patient reports that she has been sick for the past 4 to 5 weeks.  She reports ongoing cough and chest congestion.  Cough is nonproductive but is quite harsh.  She has been using cough medication without resolution.  She reports that over the past few days she has developed swollen lymph nodes in the occipital region.  No fever.  Patient also reports that she feels stopped up.  No other associated symptoms.  No other complaints.  Patient Active Problem List   Diagnosis Date Noted   OME (otitis media with effusion) 10/30/2021   Chronic cough 10/30/2021   Actinic keratosis 01/07/2021   Sun-damaged skin 01/07/2021   Generalized anxiety disorder 09/13/2019   Venous stasis 09/13/2013   Allergic rhinitis 07/27/2012   Asthma, chronic 07/27/2012    Social Hx   Social History   Socioeconomic History   Marital status: Married    Spouse name: Not on file   Number of children: Not on file   Years of education: Not on file   Highest education level: Not on file  Occupational History   Not on file  Tobacco Use   Smoking status: Never   Smokeless tobacco: Never  Vaping Use   Vaping Use: Never used  Substance and Sexual Activity   Alcohol use: Yes    Comment: occasional   Drug use: No   Sexual activity: Not on file  Other Topics Concern   Not on file  Social History Narrative   Not on file   Social Determinants of Health   Financial Resource Strain: Not on file  Food Insecurity: Not on file  Transportation Needs: Not on file  Physical Activity: Not on file  Stress: Not on file  Social Connections: Not on file    Review of  Systems Per HPI  Objective:  BP 135/81   Pulse 60   Temp 98.7 F (37.1 C)   Wt 206 lb 12.8 oz (93.8 kg)   SpO2 97%   BMI 33.38 kg/m      10/30/2021   11:28 AM 08/18/2021    2:10 PM 08/18/2021    2:05 PM  BP/Weight  Systolic BP 720 947 096  Diastolic BP 81 90 90  Wt. (Lbs) 206.8    BMI 33.38 kg/m2      Physical Exam Constitutional:      General: She is not in acute distress.    Appearance: Normal appearance.  HENT:     Head: Normocephalic and atraumatic.     Ears:     Comments: Left TM with effusion. Eyes:     General:        Right eye: No discharge.        Left eye: No discharge.     Conjunctiva/sclera: Conjunctivae normal.  Cardiovascular:     Rate and Rhythm: Normal rate and regular rhythm.  Pulmonary:     Effort: Pulmonary effort is normal.     Breath sounds: Normal breath sounds. No wheezing or rales.  Neurological:     Mental Status: She is alert.  Psychiatric:  Mood and Affect: Mood normal.        Behavior: Behavior normal.     Lab Results  Component Value Date   WBC 6.5 09/03/2019   HGB 14.4 09/03/2019   HCT 41.2 09/03/2019   PLT 253 09/03/2019   GLUCOSE 106 (H) 09/03/2019   CHOL 125 09/03/2019   TRIG 27 09/03/2019   HDL 75 09/03/2019   LDLCALC 41 09/03/2019   ALT 19 09/03/2019   AST 16 09/03/2019   NA 140 09/03/2019   K 4.5 09/03/2019   CL 105 09/03/2019   CREATININE 0.78 09/03/2019   BUN 19 09/03/2019   CO2 23 09/03/2019   TSH 2.170 09/03/2019     Assessment & Plan:   Problem List Items Addressed This Visit       Nervous and Auditory   OME (otitis media with effusion) - Primary    Treating with Augmentin.      Relevant Medications   amoxicillin-clavulanate (AUGMENTIN) 875-125 MG tablet     Other   Chronic cough    Chest x-ray was obtained and was independently reviewed by me.  Chest: Normal x-ray.  No evidence of infiltrate.  Augmentin as prescribed.      Relevant Orders   DG Chest 2 View (Completed)    Meds  ordered this encounter  Medications   amoxicillin-clavulanate (AUGMENTIN) 875-125 MG tablet    Sig: Take 1 tablet by mouth 2 (two) times daily.    Dispense:  20 tablet    Refill:  0    Follow-up:  Return if symptoms worsen or fail to improve.  [place]

## 2021-10-30 NOTE — Patient Instructions (Addendum)
Xray today.  We will call with the results.  Take care  Dr. Lacinda Axon

## 2022-01-17 ENCOUNTER — Telehealth: Payer: Self-pay | Admitting: Family Medicine

## 2022-01-17 NOTE — Telephone Encounter (Signed)
Left message for patient to call back and schedule Medicare Annual Wellness Visit (AWV) in office.   If unable to come into the office for AWV,  please offer to do virtually or by telephone.  Last AWV: patient completed with pcp 01/07/20  Please schedule at anytime with [facility] Advisor.  30 minute appointment   Any questions, please contact me at [phone]   Thank you,   [facility]  Ambulatory Clinical Support for [place] [facility] ??[phone] or ??[phone]

## 2022-02-08 NOTE — Patient Instructions (Addendum)
Olivia Huffman , Thank you for taking time to come for your Medicare Wellness Visit. I appreciate your ongoing commitment to your health goals. Please review the following plan we discussed and let me know if I can assist you in the future.   These are the goals we discussed:  Goals   None     This is a list of the screening recommended for you and due dates:  Health Maintenance  Topic Date Due   Hepatitis C Screening: USPSTF Recommendation to screen - Ages 68-79 yo.  Never done   Zoster (Shingles) Vaccine (1 of 2) Never done   COVID-19 Vaccine (3 - Moderna risk series) 11/07/2020   Flu Shot  08/01/2021   Medicare Annual Wellness Visit  01/06/2022   Colon Cancer Screening  08/19/2022*   Mammogram  04/27/2022   DTaP/Tdap/Td vaccine (2 - Td or Tdap) 06/01/2030   Pneumonia Vaccine  Completed   DEXA scan (bone density measurement)  Completed   HPV Vaccine  Aged Out  *Topic was postponed. The date shown is not the original due date.    Advanced directives: Forms are available if you choose in the future to pursue completion.  This is recommended in order to make sure that your health wishes are honored in the event that you are unable to verbalize them to the provider.    Conditions/risks identified: Aim for 30 minutes of exercise or brisk walking, 6-8 glasses of water, and 5 servings of fruits and vegetables each day.   Next appointment: Follow up in one year for your annual wellness visit    Preventive Care 68 Years and Older, Female Preventive care refers to lifestyle choices and visits with your health care provider that can promote health and wellness. What does preventive care include? A yearly physical exam. This is also called an annual well check. Dental exams once or twice a year. Routine eye exams. Ask your health care provider how often you should have your eyes checked. Personal lifestyle choices, including: Daily care of your teeth and gums. Regular physical  activity. Eating a healthy diet. Avoiding tobacco and drug use. Limiting alcohol use. Practicing safe sex. Taking low-dose aspirin every day. Taking vitamin and mineral supplements as recommended by your health care provider. What happens during an annual well check? The services and screenings done by your health care provider during your annual well check will depend on your age, overall health, lifestyle risk factors, and family history of disease. Counseling  Your health care provider may ask you questions about your: Alcohol use. Tobacco use. Drug use. Emotional well-being. Home and relationship well-being. Sexual activity. Eating habits. History of falls. Memory and ability to understand (cognition). Work and work Statistician. Reproductive health. Screening  You may have the following tests or measurements: Height, weight, and BMI. Blood pressure. Lipid and cholesterol levels. These may be checked every 5 years, or more frequently if you are over 63 years old. Skin check. Lung cancer screening. You may have this screening every year starting at age 80 if you have a 30-pack-year history of smoking and currently smoke or have quit within the past 15 years. Fecal occult blood test (FOBT) of the stool. You may have this test every year starting at age 74. Flexible sigmoidoscopy or colonoscopy. You may have a sigmoidoscopy every 5 years or a colonoscopy every 10 years starting at age 23. Hepatitis C blood test. Hepatitis B blood test. Sexually transmitted disease (STD) testing. Diabetes screening. This is done  by checking your blood sugar (glucose) after you have not eaten for a while (fasting). You may have this done every 1-3 years. Bone density scan. This is done to screen for osteoporosis. You may have this done starting at age 72. Mammogram. This may be done every 1-2 years. Talk to your health care provider about how often you should have regular mammograms. Talk with your  health care provider about your test results, treatment options, and if necessary, the need for more tests. Vaccines  Your health care provider may recommend certain vaccines, such as: Influenza vaccine. This is recommended every year. Tetanus, diphtheria, and acellular pertussis (Tdap, Td) vaccine. You may need a Td booster every 10 years. Zoster vaccine. You may need this after age 71. Pneumococcal 13-valent conjugate (PCV13) vaccine. One dose is recommended after age 19. Pneumococcal polysaccharide (PPSV23) vaccine. One dose is recommended after age 35. Talk to your health care provider about which screenings and vaccines you need and how often you need them. This information is not intended to replace advice given to you by your health care provider. Make sure you discuss any questions you have with your health care provider. Document Released: 01/14/2015 Document Revised: 09/07/2015 Document Reviewed: 10/19/2014 Elsevier Interactive Patient Education  2017 [place] Prevention in the Home Falls can cause injuries. They can happen to people of all ages. There are many things you can do to make your home safe and to help prevent falls. What can I do on the outside of my home? Regularly fix the edges of walkways and driveways and fix any cracks. Remove anything that might make you trip as you walk through a door, such as a raised step or threshold. Trim any bushes or trees on the path to your home. Use bright outdoor lighting. Clear any walking paths of anything that might make someone trip, such as rocks or tools. Regularly check to see if handrails are loose or broken. Make sure that both sides of any steps have handrails. Any raised decks and porches should have guardrails on the edges. Have any leaves, snow, or ice cleared regularly. Use sand or salt on walking paths during winter. Clean up any spills in your garage right away. This includes oil or grease spills. What can I  do in the bathroom? Use night lights. Install grab bars by the toilet and in the tub and shower. Do not use towel bars as grab bars. Use non-skid mats or decals in the tub or shower. If you need to sit down in the shower, use a plastic, non-slip stool. Keep the floor dry. Clean up any water that spills on the floor as soon as it happens. Remove soap buildup in the tub or shower regularly. Attach bath mats securely with double-sided non-slip rug tape. Do not have throw rugs and other things on the floor that can make you trip. What can I do in the bedroom? Use night lights. Make sure that you have a light by your bed that is easy to reach. Do not use any sheets or blankets that are too big for your bed. They should not hang down onto the floor. Have a firm chair that has side arms. You can use this for support while you get dressed. Do not have throw rugs and other things on the floor that can make you trip. What can I do in the kitchen? Clean up any spills right away. Avoid walking on wet floors. Keep items that you use  a lot in easy-to-reach places. If you need to reach something above you, use a strong step stool that has a grab bar. Keep electrical cords out of the way. Do not use floor polish or wax that makes floors slippery. If you must use wax, use non-skid floor wax. Do not have throw rugs and other things on the floor that can make you trip. What can I do with my stairs? Do not leave any items on the stairs. Make sure that there are handrails on both sides of the stairs and use them. Fix handrails that are broken or loose. Make sure that handrails are as long as the stairways. Check any carpeting to make sure that it is firmly attached to the stairs. Fix any carpet that is loose or worn. Avoid having throw rugs at the top or bottom of the stairs. If you do have throw rugs, attach them to the floor with carpet tape. Make sure that you have a light switch at the top of the stairs  and the bottom of the stairs. If you do not have them, ask someone to add them for you. What else can I do to help prevent falls? Wear shoes that: Do not have high heels. Have rubber bottoms. Are comfortable and fit you well. Are closed at the toe. Do not wear sandals. If you use a stepladder: Make sure that it is fully opened. Do not climb a closed stepladder. Make sure that both sides of the stepladder are locked into place. Ask someone to hold it for you, if possible. Clearly mark and make sure that you can see: Any grab bars or handrails. First and last steps. Where the edge of each step is. Use tools that help you move around (mobility aids) if they are needed. These include: Canes. Walkers. Scooters. Crutches. Turn on the lights when you go into a dark area. Replace any light bulbs as soon as they burn out. Set up your furniture so you have a clear path. Avoid moving your furniture around. If any of your floors are uneven, fix them. If there are any pets around you, be aware of where they are. Review your medicines with your doctor. Some medicines can make you feel dizzy. This can increase your chance of falling. Ask your doctor what other things that you can do to help prevent falls. This information is not intended to replace advice given to you by your health care provider. Make sure you discuss any questions you have with your health care provider. Document Released: 10/14/2008 Document Revised: 05/26/2015 Document Reviewed: 01/22/2014 Elsevier Interactive Patient Education  2017 [employer].

## 2022-02-08 NOTE — Progress Notes (Signed)
Subjective:   Olivia Huffman is a 67 y.o. female who presents for Medicare Annual (Subsequent) preventive examination.  I connected with  Olivia Huffman on 02/09/22 by a audio enabled telemedicine application and verified that I am speaking with the correct person using two identifiers.  Patient Location: Home  Provider Location: Office/Clinic  I discussed the limitations of evaluation and management by telemedicine. The patient expressed understanding and agreed to proceed.  Review of Systems     Cardiac Risk Factors include: advanced age (>51mn, >>23women)     Objective:    Today's Vitals   02/09/22 1018  Weight: 206 lb (93.4 kg)  Height: 5' 6"$  (1.676 m)   Body mass index is 33.25 kg/m.     02/09/2022   10:24 AM 05/31/2020    8:20 PM 09/06/2016   12:36 PM 08/30/2016    2:56 PM 07/08/2013   12:04 PM  Advanced Directives  Does Patient Have a Medical Advance Directive? No Yes Yes Yes Patient has advance directive, copy not in chart  Type of Advance Directive  [place] will [place] Does patient want to make changes to medical advance directive?   No - Patient declined  No change requested  Copy of [place] Chart?  No - copy requested No - copy requested    Would patient like information on creating a medical advance directive? No - Patient declined        Current Medications (verified) Outpatient Encounter Medications as of 02/09/2022  Medication Sig   acetaminophen (TYLENOL) 500 MG tablet Take 1,000 mg by mouth every 6 (six) hours as needed for mild pain or headache.   albuterol (PROVENTIL HFA;VENTOLIN HFA) 108 (90 Base) MCG/ACT inhaler Inhale 2 puffs into the lungs every 4 (four) hours as needed.   cetirizine-pseudoephedrine (ZYRTEC-D) 5-120 MG tablet Take 1 tablet by mouth daily.   clonazePAM (KLONOPIN) 0.5 MG tablet Take  1/2-1 tab po BID prn extreme anxiety   escitalopram (LEXAPRO) 10 MG tablet TAKE 1 TABLET(10 MG) BY MOUTH EVERY MORNING   meclizine (ANTIVERT) 25 MG tablet Take one tab po TID prn dizziness   montelukast (SINGULAIR) 10 MG tablet TAKE 1 TABLET(10 MG) BY MOUTH AT BEDTIME   polyethylene glycol (MIRALAX / GLYCOLAX) packet Take 17 g by mouth daily.   [DISCONTINUED] amoxicillin-clavulanate (AUGMENTIN) 875-125 MG tablet Take 1 tablet by mouth 2 (two) times daily.   No facility-administered encounter medications on file as of 02/09/2022.    Allergies (verified) Sulfonamide derivatives   History: Past Medical History:  Diagnosis Date   Arthritis    Cancer ([place]    melanoma on LLE   Constipation    Perennial allergic rhinitis    Pneumonia    hx of   Reactive airway disease    Venous stasis 09/13/2013   Past Surgical History:  Procedure Laterality Date   COLONOSCOPY     LESION REMOVAL Left 07/16/2013   Procedure: EXCISE MELANOMA OF LOWER LEFT LEG;  Surgeon: DCrissie Reese MD;  [facility]  Service: Plastics;  Laterality: Left;   MASS EXCISION Left 09/06/2016   Procedure: BIOPSY/EXCISION MASS NAILBED LEFT THUMB;  Surgeon: KDaryll Brod MD;  [facility]  Service: Orthopedics;  Laterality: Left;  REG/FAB   Family History  Problem Relation Age of Onset   Diabetes Mother    Hypertension Mother  Social History   Socioeconomic History   Marital status: Married    Spouse name: Not on file   Number of children: Not on file   Years of education: Not on file   Highest education level: Not on file  Occupational History   Not on file  Tobacco Use   Smoking status: Never   Smokeless tobacco: Never  Vaping Use   Vaping Use: Never used  Substance and Sexual Activity   Alcohol use: Yes    Comment: occasional   Drug use: No   Sexual activity: Not on file  Other Topics Concern   Not on file  Social History Narrative   Retired from working with a Chief Executive Officer     Social Determinants of Health   Financial Resource Strain: [place]  (02/09/2022)   Overall Financial Resource Strain (CARDIA)    Difficulty of Paying Living Expenses: Not hard at all  [place]: No [place] (02/09/2022)   Hunger Vital Sign    Worried About Running Out of Food in the Last Year: Never true    [place] in the Last Year: Never true  Transportation Needs: No Transportation Needs (02/09/2022)   PRAPARE - Hydrologist (Medical): No    Lack of Transportation (Non-Medical): No  Physical Activity: Sufficiently Active (02/09/2022)   Exercise Vital Sign    Days of Exercise per Week: 5 days    Minutes of Exercise per Session: 30 min  Stress: No Stress Concern Present (02/09/2022)   [place]    Feeling of Stress : Not at all  Social Connections: [place] (02/09/2022)   Social Connection and Isolation Panel [NHANES]    Frequency of Communication with Friends and Family: More than three times a week    Frequency of Social Gatherings with Friends and Family: Three times a week    Attends Religious Services: More than 4 times per year    Active Member of Clubs or Organizations: Yes    Attends Music therapist: More than 4 times per year    Marital Status: Married    Tobacco Counseling Counseling given: Not Answered   Clinical Intake:  Pre-visit preparation completed: Yes  Pain : No/denies pain  Diabetes: No  How often do you need to have someone help you when you read instructions, pamphlets, or other written materials from your doctor or pharmacy?: 1 - Never  Diabetic?No   Interpreter Needed?: No  Information entered by :: Denman George LPN   Activities of Daily Living    02/09/2022   10:25 AM  In your present state of health, do you have any difficulty performing the following activities:  Hearing? 0  Vision? 0  Difficulty  concentrating or making decisions? 0  Walking or climbing stairs? 0  Dressing or bathing? 0  Doing errands, shopping? 0  Preparing Food and eating ? N  Using the Toilet? N  In the past six months, have you accidently leaked urine? N  Do you have problems with loss of bowel control? N  Managing your Medications? N  Managing your Finances? N  Housekeeping or managing your Housekeeping? N    Patient Care Team: Coral Spikes, DO as PCP - General (Family Medicine) Allyn Kenner, MD (Dermatology)  Indicate any recent Medical Services you may have received from other than [facility] providers in the past year (date may be approximate).     Assessment:  This is a routine wellness examination for [place].  Hearing/Vision screen Hearing Screening - Comments:: Denies hearing difficulties  Vision Screening - Comments:: up to date with routine eye exams   Dietary issues and exercise activities discussed: Current Exercise Habits: Home exercise routine, Type of exercise: walking, Time (Minutes): 30, Frequency (Times/Week): 5, Weekly Exercise (Minutes/Week): 150, Intensity: Mild   Goals Addressed             This Visit's Progress    Remain Active         Depression Screen    02/09/2022   10:24 AM 10/30/2021   11:28 AM 08/18/2021    1:42 PM 01/06/2021    1:16 PM 12/05/2020    3:36 PM 09/06/2020    9:03 AM 03/11/2020    3:58 PM  PHQ 2/9 Scores  PHQ - 2 Score 0 0 0 0 0 0 0  PHQ- 9 Score    0 5  5    Fall Risk    02/09/2022   10:23 AM 10/30/2021   11:28 AM 08/18/2021    1:42 PM 01/06/2021    1:16 PM 09/06/2020    9:03 AM  [place] in the past year? 0 1 0 0 0  Number falls in past yr: 0 0 0 0 0  Injury with Fall? 0 0 0 0 0  Risk for fall due to :  No Fall Risks No Fall Risks No Fall Risks No Fall Risks  Follow up Falls prevention discussed;Education provided;Falls evaluation completed Falls evaluation completed Falls evaluation completed Falls evaluation completed Falls evaluation  completed    FALL RISK PREVENTION PERTAINING TO THE HOME:  Any stairs in or around the home? Yes  If so, are there any without handrails? No  Home free of loose throw rugs in walkways, pet beds, electrical cords, etc? No  Adequate lighting in your home to reduce risk of falls? No   ASSISTIVE DEVICES UTILIZED TO PREVENT FALLS:  Life alert? No  Use of a cane, walker or w/c? No  Grab bars in the bathroom? Yes  Shower chair or bench in shower? No  Elevated toilet seat or a handicapped toilet? Yes   TIMED UP AND GO:  Was the test performed? No . Telephonic visit   Cognitive Function:        02/09/2022   10:25 AM  6CIT Screen  What Year? 0 points  What month? 0 points  What time? 0 points  Count back from 20 0 points  Months in reverse 0 points  Repeat phrase 0 points  Total Score 0 points    Immunizations Immunization History  Administered Date(s) Administered   Fluad Quad(high Dose 65+) 11/13/2021   Influenza,inj,Quad PF,6+ Mos 11/19/2017, 12/05/2018, 09/11/2019   Influenza-Unspecified 10/31/2011, 11/17/2013, 11/23/2014, 10/23/2015, 11/19/2017, 12/05/2018, 11/28/2020   Moderna SARS-COV2 Booster Vaccination 11/05/2019, 11/05/2019, 10/10/2020   Moderna Sars-Covid-2 Vaccination 03/12/2019, 04/11/2019   PNEUMOCOCCAL CONJUGATE-20 01/06/2021   Pneumococcal Polysaccharide-23 08/29/2009   RSV,unspecified 11/13/2021   Tdap 05/31/2020    TDAP status: Up to date  Flu Vaccine status: Up to date  Pneumococcal vaccine status: Up to date  Covid-19 vaccine status: Information provided on how to obtain vaccines.   Qualifies for Shingles Vaccine? Yes   Zostavax completed No   Shingrix Completed?: No.    Education has been provided regarding the importance of this vaccine. Patient has been advised to call insurance company to determine out of pocket expense if they have not yet  received this vaccine. Advised may also receive vaccine at local pharmacy or Health Dept. Verbalized  acceptance and understanding.  Screening Tests Health Maintenance  Topic Date Due   COVID-19 Vaccine (3 - Moderna risk series) 02/25/2022 (Originally 11/07/2020)   Zoster Vaccines- Shingrix (1 of 2) 07/01/2022 (Originally 08/18/1974)   COLONOSCOPY (Pts 45-62yr Insurance coverage will need to be confirmed)  08/19/2022 (Originally 03/23/2018)   MAMMOGRAM  04/27/2022   Medicare Annual Wellness (AWV)  02/10/2023   DTaP/Tdap/Td (2 - Td or Tdap) 06/01/2030   Pneumonia Vaccine 68 Years old  Completed   INFLUENZA VACCINE  Completed   DEXA SCAN  Completed   HPV VACCINES  Aged Out   Hepatitis C Screening  Discontinued    Health Maintenance  There are no preventive care reminders to display for this patient.   Colorectal cancer screening:  Patient declines at this time   Mammogram status:  Patient would like to postpone at this time   Bone Density status: Completed 01/09/21. Results reflect: Bone density results: NORMAL. Repeat every 2 years.  Lung Cancer Screening: (Low Dose CT Chest recommended if Age 68-80years, 30 pack-year currently smoking OR have quit w/in 15years.) does not qualify.   Lung Cancer Screening Referral: n/a  Additional Screening:  Hepatitis C Screening: does qualify; Completed at next office visit   Vision Screening: Recommended annual ophthalmology exams for early detection of glaucoma and other disorders of the eye. Is the patient up to date with their annual eye exam?  Yes  Who is the provider or what is the name of the office in which the patient attends annual eye exams? Unable to provide name  If pt is not established with a provider, would they like to be referred to a provider to establish care? No .   Dental Screening: Recommended annual dental exams for proper oral hygiene  Community Resource Referral / Chronic Care Management: CRR required this visit?  No   CCM required this visit?  No      Plan:     I have personally reviewed and noted the  following in the patient's chart:   Medical and social history Use of alcohol, tobacco or illicit drugs  Current medications and supplements including opioid prescriptions. Patient is not currently taking opioid prescriptions. Functional ability and status Nutritional status Physical activity Advanced directives List of other physicians Hospitalizations, surgeries, and ER visits in previous 12 months Vitals Screenings to include cognitive, depression, and falls Referrals and appointments  In addition, I have reviewed and discussed with patient certain preventive protocols, quality metrics, and best practice recommendations. A written personalized care plan for preventive services as well as general preventive health recommendations were provided to patient.     SVanetta Mulders [place]  [id number]  Due to this being a virtual visit, the after visit summary with patients personalized plan was offered to patient via mail or my-chart. Patient would like to access on my-chart  Nurse Notes: No concerns

## 2022-02-09 ENCOUNTER — Ambulatory Visit (INDEPENDENT_AMBULATORY_CARE_PROVIDER_SITE_OTHER): Payer: Medicare Other

## 2022-02-09 VITALS — Ht 66.0 in | Wt 206.0 lb

## 2022-02-09 DIAGNOSIS — Z0000 Encounter for general adult medical examination without abnormal findings: Secondary | ICD-10-CM | POA: Diagnosis not present

## 2022-02-12 ENCOUNTER — Other Ambulatory Visit: Payer: Self-pay | Admitting: *Deleted

## 2022-02-12 MED ORDER — ESCITALOPRAM OXALATE 10 MG PO TABS: ORAL_TABLET | ORAL | 0 refills | Status: DC

## 2022-02-15 ENCOUNTER — Other Ambulatory Visit: Payer: Self-pay

## 2022-02-15 DIAGNOSIS — J45909 Unspecified asthma, uncomplicated: Secondary | ICD-10-CM

## 2022-02-15 MED ORDER — MONTELUKAST SODIUM 10 MG PO TABS: ORAL_TABLET | ORAL | 1 refills | Status: DC

## 2022-05-14 ENCOUNTER — Telehealth: Payer: Self-pay

## 2022-05-14 NOTE — Telephone Encounter (Signed)
Prescription Request  05/14/2022  LOV: Visit date not found  What is the name of the medication or equipment? escitalopram (LEXAPRO) 10 MG tablet   Have you contacted your pharmacy to request a refill? Yes   Which pharmacy would you like this sent to?  [id number] - [place], [place] - [address] [place] [address] Phone: [phone] Fax: [phone]    Patient notified that their request is being sent to the clinical staff for review and that they should receive a response within 2 business days.   Please advise at Mobile [phone] (mobile)

## 2022-05-15 MED ORDER — ESCITALOPRAM OXALATE 10 MG PO TABS: ORAL_TABLET | ORAL | 0 refills | Status: DC

## 2022-05-15 NOTE — Telephone Encounter (Signed)
Received via fax Rx request: Prescription sent electronically to pharmacy  

## 2022-08-10 ENCOUNTER — Other Ambulatory Visit: Payer: Self-pay | Admitting: Family Medicine

## 2022-10-09 DIAGNOSIS — M25562 Pain in left knee: Secondary | ICD-10-CM | POA: Diagnosis not present

## 2022-11-01 ENCOUNTER — Other Ambulatory Visit: Payer: Self-pay | Admitting: Family Medicine

## 2022-11-15 ENCOUNTER — Other Ambulatory Visit: Payer: Self-pay | Admitting: Family Medicine

## 2022-11-15 DIAGNOSIS — J45909 Unspecified asthma, uncomplicated: Secondary | ICD-10-CM

## 2022-11-27 DIAGNOSIS — M25562 Pain in left knee: Secondary | ICD-10-CM | POA: Diagnosis not present

## 2023-01-28 ENCOUNTER — Other Ambulatory Visit: Payer: Self-pay | Admitting: Family Medicine

## 2023-02-15 ENCOUNTER — Ambulatory Visit (INDEPENDENT_AMBULATORY_CARE_PROVIDER_SITE_OTHER): Payer: Medicare Other

## 2023-02-15 VITALS — Ht 66.0 in | Wt 206.0 lb

## 2023-02-15 DIAGNOSIS — Z0000 Encounter for general adult medical examination without abnormal findings: Secondary | ICD-10-CM | POA: Diagnosis not present

## 2023-02-15 DIAGNOSIS — Z1231 Encounter for screening mammogram for malignant neoplasm of breast: Secondary | ICD-10-CM

## 2023-02-15 DIAGNOSIS — Z1211 Encounter for screening for malignant neoplasm of colon: Secondary | ICD-10-CM

## 2023-02-15 DIAGNOSIS — Z0100 Encounter for examination of eyes and vision without abnormal findings: Secondary | ICD-10-CM

## 2023-02-15 NOTE — Progress Notes (Signed)
Because this visit was a virtual/telehealth visit,  certain criteria was not obtained, such a blood pressure, CBG if applicable, and timed get up and go. Any medications not marked as "taking" were not mentioned during the medication reconciliation part of the visit. Any vitals not documented were not able to be obtained due to this being a telehealth visit or patient was unable to self-report a recent blood pressure reading due to a lack of equipment at home via telehealth. Vitals that have been documented are verbally provided by the patient.  Interactive audio and video telecommunications were attempted between this provider and patient, however failed, due to patient having technical difficulties OR patient did not have access to video capability.  We continued and completed visit with audio only.  Subjective:   Olivia Huffman is a 68 y.o. female who presents for Medicare Annual (Subsequent) preventive examination.  Visit Complete: Virtual I connected with  Olivia Huffman on 02/15/23 by a audio enabled telemedicine application and verified that I am speaking with the correct person using two identifiers.  Patient Location: Home  Provider Location: Home Office  I discussed the limitations of evaluation and management by telemedicine. The patient expressed understanding and agreed to proceed.  Vital Signs: Because this visit was a virtual/telehealth visit, some criteria may be missing or patient reported. Any vitals not documented were not able to be obtained and vitals that have been documented are patient reported.   Cardiac Risk Factors include: advanced age (>80men, >56 women);sedentary lifestyle;obesity (BMI >30kg/m2)     Objective:    Today's Vitals   02/15/23 0947  Weight: 206 lb (93.4 kg)  Height: 5\' 6"  (1.676 m)   Body mass index is 33.25 kg/m.     02/15/2023    9:46 AM 02/09/2022   10:24 AM 05/31/2020    8:20 PM 09/06/2016   12:36 PM 08/30/2016    2:56 PM 07/08/2013    12:04 PM  Advanced Directives  Does Patient Have a Medical Advance Directive? No No Yes Yes Yes Patient has advance directive, copy not in chart  Type of Advance Directive   Healthcare Power of [place];Living will Healthcare Power of [place];Living will Healthcare Power of [place];Living will Healthcare Power of Attorney  Does patient want to make changes to medical advance directive?    No - Patient declined  No change requested  Copy of Healthcare Power of Attorney in Chart?   No - copy requested No - copy requested    Would patient like information on creating a medical advance directive? No - Patient declined No - Patient declined        Current Medications (verified) Outpatient Encounter Medications as of 02/15/2023  Medication Sig   acetaminophen (TYLENOL) 500 MG tablet Take 1,000 mg by mouth every 6 (six) hours as needed for mild pain or headache.   albuterol (PROVENTIL HFA;VENTOLIN HFA) 108 (90 Base) MCG/ACT inhaler Inhale 2 puffs into the lungs every 4 (four) hours as needed.   cetirizine-pseudoephedrine (ZYRTEC-D) 5-120 MG tablet Take 1 tablet by mouth daily.   clonazePAM (KLONOPIN) 0.5 MG tablet Take 1/2-1 tab po BID prn extreme anxiety   escitalopram (LEXAPRO) 10 MG tablet TAKE 1 TABLET(10 MG) BY MOUTH EVERY MORNING   meclizine (ANTIVERT) 25 MG tablet Take one tab po TID prn dizziness   montelukast (SINGULAIR) 10 MG tablet TAKE 1 TABLET(10 MG) BY MOUTH AT BEDTIME   polyethylene glycol (MIRALAX / GLYCOLAX) packet Take 17 g by mouth daily.   No  facility-administered encounter medications on file as of 02/15/2023.    Allergies (verified) Sulfonamide derivatives   History: Past Medical History:  Diagnosis Date   Arthritis    Cancer (HCC)    melanoma on LLE   Constipation    Perennial allergic rhinitis    Pneumonia    hx of   Reactive airway disease    Venous stasis 09/13/2013   Past Surgical History:  Procedure Laterality Date   COLONOSCOPY     LESION REMOVAL Left  07/16/2013   Procedure: EXCISE MELANOMA OF LOWER LEFT LEG;  Surgeon: Etter Sjogren, MD;  Location: [facility] OR;  Service: Plastics;  Laterality: Left;   MASS EXCISION Left 09/06/2016   Procedure: BIOPSY/EXCISION MASS NAILBED LEFT THUMB;  Surgeon: Cindee Salt, MD;  Location: [facility];  Service: Orthopedics;  Laterality: Left;  REG/FAB   Family History  Problem Relation Age of Onset   Diabetes Mother    Hypertension Mother    Social History   Socioeconomic History   Marital status: Married    Spouse name: Not on file   Number of children: Not on file   Years of education: Not on file   Highest education level: Not on file  Occupational History   Not on file  Tobacco Use   Smoking status: Never   Smokeless tobacco: Never  Vaping Use   Vaping status: Never Used  Substance and Sexual Activity   Alcohol use: Yes    Comment: occasional   Drug use: No   Sexual activity: Not on file  Other Topics Concern   Not on file  Social History Narrative   Retired from working with a Clinical research associate    Social Drivers of Health   Financial Resource Strain: Low Risk  (02/15/2023)   Overall Financial Resource Strain (CARDIA)    Difficulty of Paying Living Expenses: Not hard at all  Food Insecurity: No Food Insecurity (02/15/2023)   Hunger Vital Sign    Worried About Running Out of Food in the Last Year: Never true    Ran Out of Food in the Last Year: Never true  Transportation Needs: No Transportation Needs (02/15/2023)   PRAPARE - Administrator, Civil Service (Medical): No    Lack of Transportation (Non-Medical): No  Physical Activity: Insufficiently Active (02/15/2023)   Exercise Vital Sign    Days of Exercise per Week: 2 days    Minutes of Exercise per Session: 20 min  Stress: No Stress Concern Present (02/15/2023)   [employer] of Occupational Health - Occupational Stress Questionnaire    Feeling of Stress : Not at all  Social Connections: Moderately Integrated  (02/15/2023)   Social Connection and Isolation Panel [NHANES]    Frequency of Communication with Friends and Family: More than three times a week    Frequency of Social Gatherings with Friends and Family: More than three times a week    Attends Religious Services: More than 4 times per year    Active Member of [employer] or Organizations: No    Attends Engineer, structural: Never    Marital Status: Married    Tobacco Counseling Counseling given: Yes   Clinical Intake:  Pre-visit preparation completed: Yes  Pain : No/denies pain     BMI - recorded: 33.25 Nutritional Risks: None Diabetes: No  How often do you need to have someone help you when you read instructions, pamphlets, or other written materials from your doctor or pharmacy?: 1 - Never  Interpreter  Needed?: No  Information entered by :: Maryjean Ka CMA   Activities of Daily Living    02/15/2023    9:53 AM  In your present state of health, do you have any difficulty performing the following activities:  Hearing? 0  Vision? 0  Difficulty concentrating or making decisions? 0  Walking or climbing stairs? 0  Dressing or bathing? 0  Doing errands, shopping? 0  Preparing Food and eating ? N  Using the Toilet? N  In the past six months, have you accidently leaked urine? N  Do you have problems with loss of bowel control? N  Managing your Medications? N  Managing your Finances? N  Housekeeping or managing your Housekeeping? N    Patient Care Team: Tommie Sams, DO as PCP - General (Family Medicine) Nita Sells, MD (Dermatology)  Indicate any recent Medical Services you may have received from other than [facility] providers in the past year (date may be approximate).     Assessment:   This is a routine wellness examination for Olivia Huffman.  Hearing/Vision screen Hearing Screening - Comments:: Patient denies any hearing difficulties.   Vision Screening - Comments:: Patient previously had cataract surgery bilaterally.  Not up to date with exams. Referral placed for Dr. Mia Creek who did her cataract surgery   Goals Addressed             This Visit's Progress    Remain Active   On track      Depression Screen    02/15/2023    9:53 AM 02/09/2022   10:24 AM 10/30/2021   11:28 AM 08/18/2021    1:42 PM 01/06/2021    1:16 PM 12/05/2020    3:36 PM 09/06/2020    9:03 AM  PHQ 2/9 Scores  PHQ - 2 Score 0 0 0 0 0 0 0  PHQ- 9 Score     0 5     Fall Risk    02/15/2023    9:52 AM 02/09/2022   10:23 AM 10/30/2021   11:28 AM 08/18/2021    1:42 PM 01/06/2021    1:16 PM  Fall Risk   Falls in the past year? 0 0 1 0 0  Number falls in past yr: 0 0 0 0 0  Injury with Fall? 0 0 0 0 0  Risk for fall due to : No Fall Risks  No Fall Risks No Fall Risks No Fall Risks  Follow up Falls prevention discussed;Falls evaluation completed Falls prevention discussed;Education provided;Falls evaluation completed Falls evaluation completed Falls evaluation completed Falls evaluation completed    MEDICARE RISK AT HOME: Medicare Risk at Home Any stairs in or around the home?: Yes If so, are there any without handrails?: No Home free of loose throw rugs in walkways, pet beds, electrical cords, etc?: Yes Adequate lighting in your home to reduce risk of falls?: Yes Life alert?: No Use of a cane, walker or w/c?: No Grab bars in the bathroom?: Yes Shower chair or bench in shower?: No Elevated toilet seat or a handicapped toilet?: No  TIMED UP AND GO:  Was the test performed?  No    Cognitive Function:        02/15/2023    9:48 AM 02/09/2022   10:25 AM  6CIT Screen  What Year? 0 points 0 points  What month? 0 points 0 points  What time? 0 points 0 points  Count back from 20 0 points 0 points  Months in reverse 0 points 0  points  Repeat phrase 0 points 0 points  Total Score 0 points 0 points    Immunizations Immunization History  Administered Date(s) Administered   Fluad Quad(high Dose 65+) 11/13/2021    Influenza,inj,Quad PF,6+ Mos 11/19/2017, 12/05/2018, 09/11/2019   Influenza-Unspecified 10/31/2011, 11/17/2013, 11/23/2014, 10/23/2015, 11/19/2017, 12/05/2018, 11/28/2020   Moderna SARS-COV2 Booster Vaccination 11/05/2019, 11/05/2019, 10/10/2020   Moderna Sars-Covid-2 Vaccination 03/12/2019, 04/11/2019   PNEUMOCOCCAL CONJUGATE-20 01/06/2021   Pneumococcal Polysaccharide-23 08/29/2009   RSV,unspecified 11/13/2021   Tdap 05/31/2020    TDAP status: Up to date  Flu Vaccine status: Due, Education has been provided regarding the importance of this vaccine. Advised may receive this vaccine at local pharmacy or Health Dept. Aware to provide a copy of the vaccination record if obtained from local pharmacy or Health Dept. Verbalized acceptance and understanding.  Pneumococcal vaccine status: Up to date  Covid-19 vaccine status: Information provided on how to obtain vaccines.   Qualifies for Shingles Vaccine? Yes   Zostavax completed No   Shingrix Completed?: No.    Education has been provided regarding the importance of this vaccine. Patient has been advised to call insurance company to determine out of pocket expense if they have not yet received this vaccine. Advised may also receive vaccine at local pharmacy or Health Dept. Verbalized acceptance and understanding.  Screening Tests Health Maintenance  Topic Date Due   Zoster Vaccines- Shingrix (1 of 2) Never done   Colonoscopy  03/23/2018   COVID-19 Vaccine (3 - Moderna risk series) 11/07/2020   MAMMOGRAM  04/27/2022   INFLUENZA VACCINE  08/02/2022   Medicare Annual Wellness (AWV)  02/10/2023   DTaP/Tdap/Td (2 - Td or Tdap) 06/01/2030   Pneumonia Vaccine 10+ Years old  Completed   DEXA SCAN  Completed   HPV VACCINES  Aged Out   Hepatitis C Screening  Discontinued    Health Maintenance  Health Maintenance Due  Topic Date Due   Zoster Vaccines- Shingrix (1 of 2) Never done   Colonoscopy  03/23/2018   COVID-19 Vaccine (3 - Moderna  risk series) 11/07/2020   MAMMOGRAM  04/27/2022   INFLUENZA VACCINE  08/02/2022   Medicare Annual Wellness (AWV)  02/10/2023    Colorectal cancer screening: Referral to GI placed 02/15/2023. Pt aware the office will call re: appt.  Mammogram status: Ordered 02/15/2023. Pt provided with contact info and advised to call to schedule appt.   Bone Density status: Completed 01/09/2021. Results reflect: Bone density results: NORMAL. Repeat every 5 years.  Lung Cancer Screening: (Low Dose CT Chest recommended if Age 70-80 years, 20 pack-year currently smoking OR have quit w/in 15years.) does not qualify.   Lung Cancer Screening Referral: na  Additional Screening:  Hepatitis C Screening: does not qualify; Completed   Vision Screening: Recommended annual ophthalmology exams for early detection of glaucoma and other disorders of the eye. Is the patient up to date with their annual eye exam?  No  Who is the provider or what is the name of the office in which the patient attends annual eye exams? na If pt is not established with a provider, would they like to be referred to a provider to establish care? Yes .   Dental Screening: Recommended annual dental exams for proper oral hygiene  Diabetic Foot Exam: na  Community Resource Referral / Chronic Care Management: CRR required this visit?  No   CCM required this visit?  No     Plan:     I have personally reviewed and noted  the following in the patient's chart:   Medical and social history Use of alcohol, tobacco or illicit drugs  Current medications and supplements including opioid prescriptions. Patient is not currently taking opioid prescriptions. Functional ability and status Nutritional status Physical activity Advanced directives List of other physicians Hospitalizations, surgeries, and ER visits in previous 12 months Vitals Screenings to include cognitive, depression, and falls Referrals and appointments  In addition, I have  reviewed and discussed with patient certain preventive protocols, quality metrics, and best practice recommendations. A written personalized care plan for preventive services as well as general preventive health recommendations were provided to patient.     Jordan Hawks Yenesis Even, CMA   02/15/2023   After Visit Summary: (MyChart) Due to this being a telephonic visit, the after visit summary with patients personalized plan was offered to patient via MyChart   Nurse Notes: see routing comment

## 2023-02-15 NOTE — Patient Instructions (Signed)
Olivia Huffman , Thank you for taking time to come for your Medicare Wellness Visit. I appreciate your ongoing commitment to your health goals. Please review the following plan we discussed and let me know if I can assist you in the future.   Referrals/Orders/Follow-Ups/Clinician Recommendations:  Next Medicare Annual Wellness Visit: February 21, 2024 at 10:00am virtual visit.   You have an order for:  []   2D Mammogram  [x]   3D Mammogram  []   Bone Density     Please call for appointment:  [facility] [address] [place], [place] [address] [phone]  Make sure to wear two-piece clothing.  No lotions powders or deodorants the day of the appointment Make sure to bring picture ID and insurance card.  Bring list of medications you are currently taking including any supplements.   Schedule your [facility] screening mammogram through MyChart!   Log into your MyChart account.  Go to 'Visit' (or 'Appointments' if on mobile App) --> Schedule an Appointment  Under 'Select a Reason for Visit' choose the Mammogram Screening option.  Complete the pre-visit questions and select the time and place that best fits your schedule.  [facility] [address] [place] [place],  [place]  [address] Main: [phone]  You have been referred back to Dr. Mia Creek.If you don't receive a phone call from them please call them to set up an appt.   This is a list of the screening recommended for you and due dates:  Health Maintenance  Topic Date Due   Zoster (Shingles) Vaccine (1 of 2) Never done   Colon Cancer Screening  03/23/2018   COVID-19 Vaccine (3 - Moderna risk series) 11/07/2020   Mammogram  04/26/2021   Flu Shot  08/02/2022   Medicare Annual Wellness Visit  02/15/2024   DEXA scan (bone density measurement)  01/09/2026   DTaP/Tdap/Td vaccine (2 - Td or Tdap) 06/01/2030   Pneumonia Vaccine  Completed   HPV Vaccine  Aged Out   Hepatitis  C Screening  Discontinued    Advanced directives: (Declined) Advance directive discussed with you today. Even though you declined this today, please call our office should you change your mind, and we can give you the proper paperwork for you to fill out.  Next Medicare Annual Wellness Visit scheduled for next year: yes  Preventive Care 48 Years and Older, Female Preventive care refers to lifestyle choices and visits with your health care provider that can promote health and wellness. Preventive care visits are also called wellness exams. What can I expect for my preventive care visit? Counseling Your health care provider may ask you questions about your: Medical history, including: Past medical problems. Family medical history. Pregnancy and menstrual history. History of falls. Current health, including: Memory and ability to understand (cognition). Emotional well-being. Home life and relationship well-being. Sexual activity and sexual health. Lifestyle, including: Alcohol, nicotine or tobacco, and drug use. Access to firearms. Diet, exercise, and sleep habits. Work and work Astronomer. Sunscreen use. Safety issues such as seatbelt and bike helmet use. Physical exam Your health care provider will check your: Height and weight. These may be used to calculate your BMI (body mass index). BMI is a measurement that tells if you are at a healthy weight. Waist circumference. This measures the distance around your waistline. This measurement also tells if you are at a healthy weight and may help predict your risk of certain diseases, such as type 2 diabetes and  high blood pressure. Heart rate and blood pressure. Body temperature. Skin for abnormal spots. What immunizations do I need?  Vaccines are usually given at various ages, according to a schedule. Your health care provider will recommend vaccines for you based on your age, medical history, and lifestyle or other factors, such as  travel or where you work. What tests do I need? Screening Your health care provider may recommend screening tests for certain conditions. This may include: Lipid and cholesterol levels. Hepatitis C test. Hepatitis B test. HIV (human immunodeficiency virus) test. STI (sexually transmitted infection) testing, if you are at risk. Lung cancer screening. Colorectal cancer screening. Diabetes screening. This is done by checking your blood sugar (glucose) after you have not eaten for a while (fasting). Mammogram. Talk with your health care provider about how often you should have regular mammograms. BRCA-related cancer screening. This may be done if you have a family history of breast, ovarian, tubal, or peritoneal cancers. Bone density scan. This is done to screen for osteoporosis. Talk with your health care provider about your test results, treatment options, and if necessary, the need for more tests. Follow these instructions at home: Eating and drinking  Eat a diet that includes fresh fruits and vegetables, whole grains, lean protein, and low-fat dairy products. Limit your intake of foods with high amounts of sugar, saturated fats, and salt. Take vitamin and mineral supplements as recommended by your health care provider. Do not drink alcohol if your health care provider tells you not to drink. If you drink alcohol: Limit how much you have to 0-1 drink a day. Know how much alcohol is in your drink. In the [place] one drink equals one 12 oz bottle of beer (355 mL), one 5 oz glass of wine (148 mL), or one 1 oz glass of hard liquor (44 mL). Lifestyle Brush your teeth every morning and night with fluoride toothpaste. Floss one time each day. Exercise for at least 30 minutes 5 or more days each week. Do not use any products that contain nicotine or tobacco. These products include cigarettes, chewing tobacco, and vaping devices, such as e-cigarettes. If you need help quitting, ask your health care  provider. Do not use drugs. If you are sexually active, practice safe sex. Use a condom or other form of protection in order to prevent STIs. Take aspirin only as told by your health care provider. Make sure that you understand how much to take and what form to take. Work with your health care provider to find out whether it is safe and beneficial for you to take aspirin daily. Ask your health care provider if you need to take a cholesterol-lowering medicine (statin). Find healthy ways to manage stress, such as: Meditation, yoga, or listening to music. Journaling. Talking to a trusted person. Spending time with friends and family. Minimize exposure to UV radiation to reduce your risk of skin cancer. Safety Always wear your seat belt while driving or riding in a vehicle. Do not drive: If you have been drinking alcohol. Do not ride with someone who has been drinking. When you are tired or distracted. While texting. If you have been using any mind-altering substances or drugs. Wear a helmet and other protective equipment during sports activities. If you have firearms in your house, make sure you follow all gun safety procedures. What's next? Visit your health care provider once a year for an annual wellness visit. Ask your health care provider how often you should have your eyes and  teeth checked. Stay up to date on all vaccines. This information is not intended to replace advice given to you by your health care provider. Make sure you discuss any questions you have with your health care provider. Document Revised: 06/15/2020 Document Reviewed: 06/15/2020 Elsevier Patient Education  2024 [employer].  Understanding Your Risk for Falls Millions of people have serious injuries from falls each year. It is important to understand your risk of falling. Talk with your health care provider about your risk and what you can do to lower it. If you do have a serious fall, make sure to tell your  provider. Falling once raises your risk of falling again. How can falls affect me? Serious injuries from falls are common. These include: Broken bones, such as hip fractures. Head injuries, such as traumatic brain injuries (TBI) or concussions. A fear of falling can cause you to avoid activities and stay at home. This can make your muscles weaker and raise your risk for a fall. What can increase my risk? There are a number of risk factors that increase your risk for falling. The more risk factors you have, the higher your risk of falling. Serious injuries from a fall happen most often to people who are older than 69 years old. Teenagers and young adults ages 44-29 are also at higher risk. Common risk factors include: Weakness in the lower body. Being generally weak or confused due to long-term (chronic) illness. Dizziness or balance problems. Poor vision. Medicines that cause dizziness or drowsiness. These may include: Medicines for your blood pressure, heart, anxiety, insomnia, or swelling (edema). Pain medicines. Muscle relaxants. Other risk factors include: Drinking alcohol. Having had a fall in the past. Having foot pain or wearing improper footwear. Working at a dangerous job. Having any of the following in your home: Tripping hazards, such as floor clutter or loose rugs. Poor lighting. Pets. Having dementia or memory loss. What actions can I take to lower my risk of falling?     Physical activity Stay physically fit. Do strength and balance exercises. Consider taking a regular class to build strength and balance. Yoga and tai chi are good options. Vision Have your eyes checked every year and your prescription for glasses or contacts updated as needed. Shoes and walking aids Wear non-skid shoes. Wear shoes that have rubber soles and low heels. Do not wear high heels. Do not walk around the house in socks or slippers. Use a cane or walker as told by your provider. Home  safety Attach secure railings on both sides of your stairs. Install grab bars for your bathtub, shower, and toilet. Use a non-skid mat in your bathtub or shower. Attach bath mats securely with double-sided, non-slip rug tape. Use good lighting in all rooms. Keep a flashlight near your bed. Make sure there is a clear path from your bed to the bathroom. Use night-lights. Do not use throw rugs. Make sure all carpeting is taped or tacked down securely. Remove all clutter from walkways and stairways, including extension cords. Repair uneven or broken steps and floors. Avoid walking on icy or slippery surfaces. Walk on the grass instead of on icy or slick sidewalks. Use ice melter to get rid of ice on walkways in the winter. Use a cordless phone. Questions to ask your health care provider Can you help me check my risk for a fall? Do any of my medicines make me more likely to fall? Should I take a vitamin D supplement? What exercises can I  do to improve my strength and balance? Should I make an appointment to have my vision checked? Do I need a bone density test to check for weak bones (osteoporosis)? Would it help to use a cane or a walker? Where to find more information Centers for Disease Control and Prevention, [employer]: TonerPromos.no Community-Based Fall Prevention Programs: TonerPromos.no [employer] on Aging: BaseRingTones.pl Contact a health care provider if: You fall at home. You are afraid of falling at home. You feel weak, drowsy, or dizzy. This information is not intended to replace advice given to you by your health care provider. Make sure you discuss any questions you have with your health care provider. Document Revised: 08/21/2021 Document Reviewed: 08/21/2021 Elsevier Patient Education  2024 [employer].

## 2023-03-06 DIAGNOSIS — C44519 Basal cell carcinoma of skin of other part of trunk: Secondary | ICD-10-CM | POA: Diagnosis not present

## 2023-03-06 DIAGNOSIS — D225 Melanocytic nevi of trunk: Secondary | ICD-10-CM | POA: Diagnosis not present

## 2023-03-06 DIAGNOSIS — Z1283 Encounter for screening for malignant neoplasm of skin: Secondary | ICD-10-CM | POA: Diagnosis not present

## 2023-03-06 DIAGNOSIS — X32XXXD Exposure to sunlight, subsequent encounter: Secondary | ICD-10-CM | POA: Diagnosis not present

## 2023-03-06 DIAGNOSIS — L570 Actinic keratosis: Secondary | ICD-10-CM | POA: Diagnosis not present

## 2023-03-06 DIAGNOSIS — Z08 Encounter for follow-up examination after completed treatment for malignant neoplasm: Secondary | ICD-10-CM | POA: Diagnosis not present

## 2023-03-06 DIAGNOSIS — Z85820 Personal history of malignant melanoma of skin: Secondary | ICD-10-CM | POA: Diagnosis not present

## 2023-03-29 DIAGNOSIS — M25511 Pain in right shoulder: Secondary | ICD-10-CM | POA: Diagnosis not present

## 2023-03-29 DIAGNOSIS — M25521 Pain in right elbow: Secondary | ICD-10-CM | POA: Diagnosis not present

## 2023-03-29 DIAGNOSIS — S99921A Unspecified injury of right foot, initial encounter: Secondary | ICD-10-CM | POA: Diagnosis not present

## 2023-03-29 DIAGNOSIS — S42294A Other nondisplaced fracture of upper end of right humerus, initial encounter for closed fracture: Secondary | ICD-10-CM | POA: Diagnosis not present

## 2023-03-29 DIAGNOSIS — S90111A Contusion of right great toe without damage to nail, initial encounter: Secondary | ICD-10-CM | POA: Diagnosis not present

## 2023-03-29 DIAGNOSIS — S42301A Unspecified fracture of shaft of humerus, right arm, initial encounter for closed fracture: Secondary | ICD-10-CM | POA: Diagnosis not present

## 2023-03-29 DIAGNOSIS — M79621 Pain in right upper arm: Secondary | ICD-10-CM | POA: Diagnosis not present

## 2023-03-29 DIAGNOSIS — M79674 Pain in right toe(s): Secondary | ICD-10-CM | POA: Diagnosis not present

## 2023-04-04 DIAGNOSIS — M542 Cervicalgia: Secondary | ICD-10-CM | POA: Diagnosis not present

## 2023-04-04 DIAGNOSIS — M25511 Pain in right shoulder: Secondary | ICD-10-CM | POA: Diagnosis not present

## 2023-04-25 DIAGNOSIS — M25511 Pain in right shoulder: Secondary | ICD-10-CM | POA: Diagnosis not present

## 2023-05-02 ENCOUNTER — Other Ambulatory Visit: Payer: Self-pay | Admitting: Family Medicine

## 2023-05-07 DIAGNOSIS — S42294D Other nondisplaced fracture of upper end of right humerus, subsequent encounter for fracture with routine healing: Secondary | ICD-10-CM | POA: Diagnosis not present

## 2023-05-20 ENCOUNTER — Other Ambulatory Visit: Payer: Self-pay | Admitting: Family Medicine

## 2023-05-20 DIAGNOSIS — J45909 Unspecified asthma, uncomplicated: Secondary | ICD-10-CM

## 2023-05-21 DIAGNOSIS — M25611 Stiffness of right shoulder, not elsewhere classified: Secondary | ICD-10-CM | POA: Diagnosis not present

## 2023-05-21 DIAGNOSIS — M25511 Pain in right shoulder: Secondary | ICD-10-CM | POA: Diagnosis not present

## 2023-06-04 DIAGNOSIS — M25511 Pain in right shoulder: Secondary | ICD-10-CM | POA: Diagnosis not present

## 2023-08-05 ENCOUNTER — Other Ambulatory Visit: Payer: Self-pay | Admitting: Family Medicine

## 2023-09-03 DIAGNOSIS — S40862A Insect bite (nonvenomous) of left upper arm, initial encounter: Secondary | ICD-10-CM | POA: Diagnosis not present

## 2023-09-03 DIAGNOSIS — L255 Unspecified contact dermatitis due to plants, except food: Secondary | ICD-10-CM | POA: Diagnosis not present

## 2023-09-03 DIAGNOSIS — C44519 Basal cell carcinoma of skin of other part of trunk: Secondary | ICD-10-CM | POA: Diagnosis not present

## 2023-09-03 DIAGNOSIS — Z08 Encounter for follow-up examination after completed treatment for malignant neoplasm: Secondary | ICD-10-CM | POA: Diagnosis not present

## 2023-09-03 DIAGNOSIS — Z85820 Personal history of malignant melanoma of skin: Secondary | ICD-10-CM | POA: Diagnosis not present

## 2023-11-26 ENCOUNTER — Other Ambulatory Visit: Payer: Self-pay | Admitting: Family Medicine

## 2023-11-26 DIAGNOSIS — J45909 Unspecified asthma, uncomplicated: Secondary | ICD-10-CM

## 2024-02-21 ENCOUNTER — Ambulatory Visit: Payer: Medicare Other
